# Patient Record
Sex: Male | Born: 1950 | Race: Black or African American | Hispanic: No | Smoking: Current every day smoker
Health system: Southern US, Community
[De-identification: ages and names within clinical notes are randomized; demographics above are authoritative.]

## PROBLEM LIST (undated history)

## (undated) DIAGNOSIS — C649 Malignant neoplasm of unspecified kidney, except renal pelvis: Secondary | ICD-10-CM

## (undated) DIAGNOSIS — N182 Chronic kidney disease, stage 2 (mild): Secondary | ICD-10-CM

## (undated) DIAGNOSIS — I714 Abdominal aortic aneurysm, without rupture, unspecified: Secondary | ICD-10-CM

## (undated) DIAGNOSIS — Z21 Asymptomatic human immunodeficiency virus [HIV] infection status: Secondary | ICD-10-CM

## (undated) DIAGNOSIS — I10 Essential (primary) hypertension: Secondary | ICD-10-CM

## (undated) DIAGNOSIS — B182 Chronic viral hepatitis C: Secondary | ICD-10-CM

## (undated) DIAGNOSIS — B20 Human immunodeficiency virus [HIV] disease: Secondary | ICD-10-CM

## (undated) HISTORY — PX: HERNIA REPAIR: SHX51

## (undated) HISTORY — PX: ABDOMINAL AORTIC ANEURYSM REPAIR: SUR1152

---

## 2011-07-09 ENCOUNTER — Encounter (HOSPITAL_COMMUNITY): Payer: Self-pay | Admitting: *Deleted

## 2011-07-09 ENCOUNTER — Emergency Department (HOSPITAL_COMMUNITY)
Admission: EM | Admit: 2011-07-09 | Discharge: 2011-07-10 | Disposition: A | Discharge status: Home / Self Care | Payer: Medicare Other | Attending: Emergency Medicine | Admitting: Emergency Medicine

## 2011-07-09 ENCOUNTER — Emergency Department (HOSPITAL_COMMUNITY): Payer: Medicare Other

## 2011-07-09 DIAGNOSIS — N289 Disorder of kidney and ureter, unspecified: Secondary | ICD-10-CM | POA: Insufficient documentation

## 2011-07-09 DIAGNOSIS — Z8619 Personal history of other infectious and parasitic diseases: Secondary | ICD-10-CM | POA: Insufficient documentation

## 2011-07-09 DIAGNOSIS — B20 Human immunodeficiency virus [HIV] disease: Secondary | ICD-10-CM

## 2011-07-09 DIAGNOSIS — Z79899 Other long term (current) drug therapy: Secondary | ICD-10-CM | POA: Insufficient documentation

## 2011-07-09 DIAGNOSIS — W19XXXA Unspecified fall, initial encounter: Secondary | ICD-10-CM

## 2011-07-09 DIAGNOSIS — M25519 Pain in unspecified shoulder: Secondary | ICD-10-CM | POA: Insufficient documentation

## 2011-07-09 DIAGNOSIS — T1490XA Injury, unspecified, initial encounter: Secondary | ICD-10-CM | POA: Insufficient documentation

## 2011-07-09 DIAGNOSIS — K5730 Diverticulosis of large intestine without perforation or abscess without bleeding: Secondary | ICD-10-CM | POA: Insufficient documentation

## 2011-07-09 DIAGNOSIS — W138XXA Fall from, out of or through other building or structure, initial encounter: Secondary | ICD-10-CM | POA: Insufficient documentation

## 2011-07-09 DIAGNOSIS — M549 Dorsalgia, unspecified: Secondary | ICD-10-CM | POA: Insufficient documentation

## 2011-07-09 DIAGNOSIS — C649 Malignant neoplasm of unspecified kidney, except renal pelvis: Secondary | ICD-10-CM | POA: Insufficient documentation

## 2011-07-09 DIAGNOSIS — I10 Essential (primary) hypertension: Secondary | ICD-10-CM | POA: Insufficient documentation

## 2011-07-09 DIAGNOSIS — R109 Unspecified abdominal pain: Secondary | ICD-10-CM | POA: Insufficient documentation

## 2011-07-09 DIAGNOSIS — Z21 Asymptomatic human immunodeficiency virus [HIV] infection status: Secondary | ICD-10-CM | POA: Insufficient documentation

## 2011-07-09 DIAGNOSIS — K8020 Calculus of gallbladder without cholecystitis without obstruction: Secondary | ICD-10-CM | POA: Insufficient documentation

## 2011-07-09 HISTORY — DX: Human immunodeficiency virus (HIV) disease: B20

## 2011-07-09 HISTORY — DX: Chronic viral hepatitis C: B18.2

## 2011-07-09 HISTORY — DX: Malignant neoplasm of unspecified kidney, except renal pelvis: C64.9

## 2011-07-09 HISTORY — DX: Chronic kidney disease, stage 2 (mild): N18.2

## 2011-07-09 HISTORY — DX: Abdominal aortic aneurysm, without rupture: I71.4

## 2011-07-09 HISTORY — DX: Abdominal aortic aneurysm, without rupture, unspecified: I71.40

## 2011-07-09 HISTORY — DX: Essential (primary) hypertension: I10

## 2011-07-09 HISTORY — DX: Asymptomatic human immunodeficiency virus (hiv) infection status: Z21

## 2011-07-09 LAB — POCT I-STAT, CHEM 8
BUN: 42 mg/dL — ABNORMAL HIGH (ref 6–23)
Calcium, Ion: 1.27 mmol/L (ref 1.12–1.32)
Calcium, Ion: 1.21 mmol/L (ref 1.12–1.32)
Creatinine, Ser: 2.1 mg/dL — ABNORMAL HIGH (ref 0.50–1.35)
Creatinine, Ser: 1.9 mg/dL — ABNORMAL HIGH (ref 0.50–1.35)
Glucose, Bld: 108 mg/dL — ABNORMAL HIGH (ref 70–99)
Glucose, Bld: 97 mg/dL (ref 70–99)
HCT: 36 % — ABNORMAL LOW (ref 39.0–52.0)
Hemoglobin: 12.9 g/dL — ABNORMAL LOW (ref 13.0–17.0)
Hemoglobin: 12.2 g/dL — ABNORMAL LOW (ref 13.0–17.0)
Sodium: 142 mEq/L (ref 135–145)
TCO2: 19 mmol/L (ref 0–100)
TCO2: 16 mmol/L (ref 0–100)

## 2011-07-09 LAB — CBC
Hemoglobin: 12.9 g/dL — ABNORMAL LOW (ref 13.0–17.0)
MCH: 30.4 pg (ref 26.0–34.0)
MCHC: 33.6 g/dL (ref 30.0–36.0)
MCV: 90.6 fL (ref 78.0–100.0)
RBC: 4.24 MIL/uL (ref 4.22–5.81)

## 2011-07-09 MED ORDER — MORPHINE SULFATE 4 MG/ML IJ SOLN
4.0000 mg | Freq: Once | INTRAMUSCULAR | Status: AC
  Administered 2011-07-09: 4 mg via INTRAVENOUS
  Filled 2011-07-09: qty 1

## 2011-07-09 MED ORDER — ONDANSETRON HCL 4 MG/2ML IJ SOLN
4.0000 mg | Freq: Once | INTRAMUSCULAR | Status: AC
  Administered 2011-07-09: 4 mg via INTRAVENOUS
  Filled 2011-07-09: qty 2

## 2011-07-09 MED ORDER — TRAMADOL HCL 50 MG PO TABS: 50.0000 mg | ORAL_TABLET | Freq: Four times a day (QID) | ORAL | Status: DC | PRN

## 2011-07-09 MED ORDER — SODIUM CHLORIDE 0.9 % IV BOLUS (SEPSIS): 2000.0000 mL | Freq: Once | INTRAVENOUS | Status: DC

## 2011-07-09 NOTE — ED Notes (Signed)
Pt states he landed on his left side. Pt c/o of right shoulder pain and LLQ abdominal pain with tenderness

## 2011-07-09 NOTE — ED Provider Notes (Signed)
Medical screening examination/treatment/procedure(s) were performed by non-physician practitioner and as supervising physician I was immediately available for consultation/collaboration.  Yanis Juma R. Halina Asano, MD 07/09/11 2353 

## 2011-07-09 NOTE — ED Provider Notes (Signed)
History     CSN: [id number]  Arrival date & time 07/09/11  1815   First MD Initiated Contact with Patient 07/09/11 2027      Chief Complaint  Patient presents with  . Fall  . Back Pain  . Rib Pain   . Chest Pain    (Consider location/radiation/quality/duration/timing/severity/associated sxs/prior treatment) HPI [facility] Dr. Copper  Dr. Earlene Plater  Pt brought to ED and put in exam room coded as a level 3. He was brought in by his sister after falling off of a second story roof and landed on his left side. He denies head pain or neck pain, denies LOC. He admits to right shoulder pain, lumbar pain and left sided flank pain. He says he was up their cleaning a friends gutter. He states that what hurts the worst is his abdomen. . The patient denies feeling lightheaded dizzy or confused. His VSS, he is in NAD and denies pain medication at this time.  Patient has PMH of renal cell cancer, renal insufficiency and HIV. The patient is also requesting Detox from heroine.  Past Medical History  Diagnosis Date  . Abdominal aortic aneurysm   . Hypertension   . Renal cell cancer   . Hepatitis C carrier   . Chronic renal insufficiency, stage II (mild)   . HIV (human immunodeficiency virus infection)   . Chronic hepatitis C     Past Surgical History  Procedure Date  . Hernia repair   . Abdominal aortic aneurysm repair     No family history on file.  History  Substance Use Topics  . Smoking status: Current Everyday Smoker  . Smokeless tobacco: Not on file  . Alcohol Use: Yes     pint of liquor a day.      Review of Systems   HEENT: denies blurry vision or change in hearing PULMONARY: Denies difficulty breathing and SOB CARDIAC: denies chest pain or heart palpitations MUSCULOSKELETAL:  denies being unable to ambulate ABDOMEN AL: denies abdominal pain GU: denies loss of bowel or urinary control NEURO: denies numbness and tingling in extremities SKIN: no new  rashes PSYCH: patient behavior is normal NECK: Not complaining of neck pain     Allergies  Review of patient's allergies indicates no known allergies.  Home Medications   Current Outpatient Rx  Name Route Sig Dispense Refill  . ABACAVIR SULFATE-LAMIVUDINE 600-300 MG PO TABS Oral Take 1 tablet by mouth daily.    . EFAVIRENZ 600 MG PO TABS Oral Take 600 mg by mouth at bedtime.    Marland Kitchen LISINOPRIL-HYDROCHLOROTHIAZIDE 20-25 MG PO TABS Oral Take 1 tablet by mouth daily.    Marland Kitchen ZOLPIDEM TARTRATE 5 MG PO TABS Oral Take 5 mg by mouth at bedtime as needed. sleep    . TRAMADOL HCL 50 MG PO TABS Oral Take 1 tablet (50 mg total) by mouth every 6 (six) hours as needed. pain 15 tablet 0    BP 166/103  Pulse 109  Temp 98.1 F (36.7 C) (Oral)  Resp 22  SpO2 94%  Physical Exam  Nursing note and vitals reviewed. Constitutional: He appears well-developed and well-nourished. No distress.  HENT:  Head: Normocephalic and atraumatic.  Eyes: Pupils are equal, round, and reactive to light.  Neck: Normal range of motion. Neck supple.  Cardiovascular: Normal rate and regular rhythm.   Pulmonary/Chest: Effort normal.  Abdominal: Soft. He exhibits no distension. There is tenderness. There is guarding. There is no rebound.  Musculoskeletal:  Equal strength to bilateral lower extremities. Neurosensory  function adequate to both legs. Skin color is normal. Skin is warm and moist. I see no step off deformity, no bony tenderness. Pt is able to ambulate without limp. Pain is relieved when sitting in certain positions. ROM is decreased due to pain. No crepitus, laceration, effusion, swelling.  Pulses are normal   Neurological: He is alert.  Skin: Skin is warm and dry.    ED Course  Procedures (including critical care time)  Labs Reviewed  CBC - Abnormal; Notable for the following:    Hemoglobin 12.9 (*)     HCT 38.4 (*)     Platelets 148 (*)     All other components within normal limits  POCT  I-STAT, CHEM 8 - Abnormal; Notable for the following:    Chloride 114 (*)     BUN 42 (*)     Creatinine, Ser 2.10 (*)     Glucose, Bld 108 (*)     Hemoglobin 12.9 (*)     HCT 38.0 (*)     All other components within normal limits  POCT I-STAT, CHEM 8 - Abnormal; Notable for the following:    Chloride 114 (*)     BUN 38 (*)     Creatinine, Ser 1.90 (*)     Hemoglobin 12.2 (*)     HCT 36.0 (*)     All other components within normal limits   Ct Abdomen Pelvis Wo Contrast  07/09/2011  *RADIOLOGY REPORT*  Clinical Data: Fall.  abdominal and back pain.  Renal insufficiency.  CT ABDOMEN AND PELVIS WITHOUT CONTRAST  Technique:  Multidetector CT imaging of the abdomen and pelvis was performed following the standard protocol without intravenous contrast.  Comparison: None.  Findings: No evidence of ureteral calculi or hydronephrosis.  The right kidney contains multiple high attenuation lesions which may represent hemorrhagic cysts although solid renal masses cannot be excluded.  There is also a 5 cm cystic lesion in the upper pole which contains peripheral nodular calcification, but cannot be fully characterized on this noncontrast study.  The left kidney also contains several high attenuation lesions which may represent hemorrhagic cysts although solid masses cannot be excluded on this noncontrast study.  A subcapsular mass in the mid pole of the right kidney measuring 3.2 cm contains some internal fat, consistent with a benign angiomyolipoma.  Cholelithiasis is seen, without evidence of acute cholecystitis. The liver, spleen, pancreas, and adrenal glands have a normal appearance on this noncontrast study.  No lymphadenopathy identified.  The patient has undergone stent graft repair of abdominal aortic aneurysm and right common iliac artery aneurysm.  No evidence of retroperitoneal hemorrhage.  No inflammatory process or abnormal fluid collections are identified.  Normal appendix is visualized.  Diverticulosis is seen involving the descending and sigmoid colon, however there is no evidence of diverticulitis.  IMPRESSION:  1.  No evidence of ureteral calculi, hydronephrosis, or other acute findings. 2.  Bilateral indeterminate renal lesions.  Renal neoplasm cannot be excluded. Abdomen MRI without contrast should be considered for further characterization. 3.  3 cm benign left renal angiomyolipoma. 4.  Cholelithiasis. 5.  Diverticulosis.  No radiographic evidence of diverticulitis.  Original Report Authenticated By: Danae Orleans, M.D.   Dg Chest 2 View  07/09/2011  *RADIOLOGY REPORT*  Clinical Data: Fall, back pain, chest pain  CHEST - 2 VIEW  Comparison: None.  Findings: Cardiomediastinal silhouette is unremarkable.  No acute infiltrate or pleural effusion.  No pulmonary  edema.  Bony thorax is unremarkable.  IMPRESSION: No active disease.  Original Report Authenticated By: Natasha Mead, M.D.   Dg Lumbar Spine Complete  07/09/2011  *RADIOLOGY REPORT*  Clinical Data: Larey Seat and injured low back.  LUMBAR SPINE - COMPLETE 4+ VIEW  Comparison: None.  Findings: Five non-rib bearing lumbar vertebrae with anatomic posterior alignment.  Straightening of the usual lumbar lordosis. Lumbar scoliosis convex right.  No fractures.  Disc space narrowing and endplate hypertrophic changes at every level from L2-3 through L5-S1, worst at L3-4.  Diffuse facet degenerative changes.  No pars defects.  Visualized sacroiliac joints intact.  Prior aorto-bi- iliac stent graft.  IMPRESSION: No acute osseous abnormality.  Diffuse degenerative disc disease, spondylosis, and facet degenerative changes.  Straightening of the usual lordosis which may reflect positioning and/or spasm. Scoliosis.  Original Report Authenticated By: Arnell Sieving, M.D.   Dg Shoulder Right  07/09/2011  *RADIOLOGY REPORT*  Clinical Data: Larey Seat and injured right shoulder.  RIGHT SHOULDER - 2+ VIEW  Comparison: None.  Findings: No evidence of acute  fracture or dislocation.  Slight narrowing of the subacromial space.  Acromioclavicular joint intact.  Well-preserved bone mineral density.  IMPRESSION: No acute osseous abnormality.  Slight narrowing the subacromial space which can be seen with supraspinatus tendon impingement.  Original Report Authenticated By: Arnell Sieving, M.D.     1. Fall   2. Renal insufficiency   3. HIV (human immunodeficiency virus infection)   4. Renal cell cancer       MDM  Patients labs have come back non acute. With signs of muscle strains but no fractures or intraabdominal bleeding.  Patient oral challenged in the ED. ACT team has given patient detox referrals for heroine.  Patients creatinine is at baseline although elevated.  Patient re-evaluated prior to discharge. No neurological deficits and belly pain continues to be resolving.  Patient is to follow-up with his PCP.  Pt has been advised of the symptoms that warrant their return to the ED. Patient has voiced understanding and has agreed to follow-up with the PCP or specialist.         Dorthula Matas, PA 07/09/11 2341

## 2011-07-09 NOTE — ED Notes (Signed)
Pt reports falling off a roof approx 2 stories high approx 1745 today. C/o back pain, L rib pain, L chest pain, pain worse with movement and a deep breath.

## 2011-12-05 ENCOUNTER — Encounter (HOSPITAL_COMMUNITY): Payer: Self-pay

## 2011-12-05 ENCOUNTER — Emergency Department (HOSPITAL_COMMUNITY)
Admission: EM | Admit: 2011-12-05 | Discharge: 2011-12-07 | Disposition: A | Discharge status: Other Acute Inpatient Hospital | Payer: Medicare Other | Source: Home / Self Care | Attending: Emergency Medicine | Admitting: Emergency Medicine

## 2011-12-05 DIAGNOSIS — F3289 Other specified depressive episodes: Secondary | ICD-10-CM | POA: Insufficient documentation

## 2011-12-05 DIAGNOSIS — F17200 Nicotine dependence, unspecified, uncomplicated: Secondary | ICD-10-CM | POA: Insufficient documentation

## 2011-12-05 DIAGNOSIS — F329 Major depressive disorder, single episode, unspecified: Secondary | ICD-10-CM | POA: Insufficient documentation

## 2011-12-05 DIAGNOSIS — C649 Malignant neoplasm of unspecified kidney, except renal pelvis: Secondary | ICD-10-CM | POA: Insufficient documentation

## 2011-12-05 DIAGNOSIS — Z21 Asymptomatic human immunodeficiency virus [HIV] infection status: Secondary | ICD-10-CM | POA: Insufficient documentation

## 2011-12-05 DIAGNOSIS — I129 Hypertensive chronic kidney disease with stage 1 through stage 4 chronic kidney disease, or unspecified chronic kidney disease: Secondary | ICD-10-CM | POA: Insufficient documentation

## 2011-12-05 DIAGNOSIS — I714 Abdominal aortic aneurysm, without rupture, unspecified: Secondary | ICD-10-CM | POA: Insufficient documentation

## 2011-12-05 DIAGNOSIS — Z79899 Other long term (current) drug therapy: Secondary | ICD-10-CM | POA: Insufficient documentation

## 2011-12-05 DIAGNOSIS — F1110 Opioid abuse, uncomplicated: Secondary | ICD-10-CM | POA: Insufficient documentation

## 2011-12-05 DIAGNOSIS — B182 Chronic viral hepatitis C: Secondary | ICD-10-CM | POA: Insufficient documentation

## 2011-12-05 DIAGNOSIS — F1010 Alcohol abuse, uncomplicated: Secondary | ICD-10-CM

## 2011-12-05 DIAGNOSIS — N182 Chronic kidney disease, stage 2 (mild): Secondary | ICD-10-CM | POA: Insufficient documentation

## 2011-12-05 LAB — CBC
HCT: 32.8 % — ABNORMAL LOW (ref 39.0–52.0)
Hemoglobin: 10.9 g/dL — ABNORMAL LOW (ref 13.0–17.0)
MCH: 30 pg (ref 26.0–34.0)
MCHC: 33.2 g/dL (ref 30.0–36.0)
MCV: 90.4 fL (ref 78.0–100.0)
Platelets: 165 10*3/uL (ref 150–400)
RBC: 3.63 MIL/uL — ABNORMAL LOW (ref 4.22–5.81)
RDW: 14.3 % (ref 11.5–15.5)
WBC: 4.5 10*3/uL (ref 4.0–10.5)

## 2011-12-05 LAB — RAPID URINE DRUG SCREEN, HOSP PERFORMED
Amphetamines: NOT DETECTED
Barbiturates: NOT DETECTED
Benzodiazepines: NOT DETECTED
Cocaine: NOT DETECTED
Opiates: POSITIVE — AB
Tetrahydrocannabinol: NOT DETECTED

## 2011-12-05 LAB — BASIC METABOLIC PANEL
BUN: 36 mg/dL — ABNORMAL HIGH (ref 6–23)
CO2: 20 mEq/L (ref 19–32)
Calcium: 9.3 mg/dL (ref 8.4–10.5)
Chloride: 108 mEq/L (ref 96–112)
Creatinine, Ser: 1.74 mg/dL — ABNORMAL HIGH (ref 0.50–1.35)
GFR calc Af Amer: 48 mL/min — ABNORMAL LOW (ref >90)
GFR calc non Af Amer: 41 mL/min — ABNORMAL LOW (ref >90)
Glucose, Bld: 111 mg/dL — ABNORMAL HIGH (ref 70–99)
Potassium: 5.2 mEq/L — ABNORMAL HIGH (ref 3.5–5.1)
Sodium: 139 mEq/L (ref 135–145)

## 2011-12-05 LAB — ETHANOL: Alcohol, Ethyl (B): 21 mg/dL — ABNORMAL HIGH (ref 0–11)

## 2011-12-05 NOTE — BH Assessment (Signed)
Assessment Note   Russell [name] is an 61 y.o. male that was assessed this day.  Pt is self-referred seeking detox from heroin and ETOH.  Pt reported he relapsed in January of this year and his use became worse after the death of his mother in 04-17-22 and his sister-in-law soon thereafter.  Pt reports he drinks 1 pint of wine daily, last use this morning and pt drank one pint.  Pt reports using 4-5 bags of heroin a day, last use was 3 bags yesterday on 12/04/11.  Pt stated his use is affecting his finances and he was unable to pay his bills.  Pt also reports he wants to get better physically, as he has several medical problems.  Pt has had detox once years ago and no other MH or SA treatment.  Pt does endorse sx of depression and is suffering from bereavement.  Pt pleasant, cooperative and wants detox.  Pt denies SI/HI or psychosis.  Pt denies seizure hx.  Completed assessment, assessment notification and faxed to [facility] to run for possible admission.  Updated ED staff.  Axis I: Bereavement, Depressive Disorder NOS and Opioid Dependence, Alcohol Dependence Axis II: Deferred Axis III:  Past Medical History  Diagnosis Date  . Abdominal aortic aneurysm   . Hypertension   . Renal cell cancer   . Hepatitis C carrier   . Chronic renal insufficiency, stage II (mild)   . HIV (human immunodeficiency virus infection)   . Chronic hepatitis C    Axis IV: economic problems, occupational problems and other psychosocial or environmental problems Axis V: 31-40 impairment in reality testing  Past Medical History:  Past Medical History  Diagnosis Date  . Abdominal aortic aneurysm   . Hypertension   . Renal cell cancer   . Hepatitis C carrier   . Chronic renal insufficiency, stage II (mild)   . HIV (human immunodeficiency virus infection)   . Chronic hepatitis C     Past Surgical History  Procedure Date  . Hernia repair   . Abdominal aortic aneurysm repair     Family History: No family history on  file.  Social History:  reports that he has been smoking.  He does not have any smokeless tobacco history on file. He reports that he drinks alcohol. He reports that he uses illicit drugs (IV).  Additional Social History:  Alcohol / Drug Use Pain Medications: see MAR Prescriptions: see MAR Over the Counter: see MAR History of alcohol / drug use?: Yes Substance #1 Name of Substance 1: Heroin 1 - Age of First Use: 25 1 - Amount (size/oz): 4-5 bags 1 - Frequency: daily 1 - Duration: ongoing on and off for years 1 - Last Use / Amount: 12/04/11 at 0100 - 3 bags Substance #2 Name of Substance 2: ETOH - wine 2 - Age of First Use: 22 2 - Amount (size/oz): 1 pint 2 - Frequency: daily 2 - Duration: Ongoing since Feb 2013 2 - Last Use / Amount: 12/04/12 - 1 pint this AM  CIWA: CIWA-Ar BP: 126/69 mmHg Pulse Rate: 98  Nausea and Vomiting: no nausea and no vomiting Tactile Disturbances: none Tremor: no tremor Auditory Disturbances: not present Paroxysmal Sweats: no sweat visible Visual Disturbances: not present Anxiety: no anxiety, at ease Headache, Fullness in Head: none present Agitation: normal activity Orientation and Clouding of Sensorium: oriented and can do serial additions CIWA-Ar Total: 0  COWS: Clinical Opiate Withdrawal Scale (COWS) Resting Pulse Rate: Pulse Rate 80 or below Sweating: No  report of chills or flushing Restlessness: Able to sit still Pupil Size: Pupils pinned or normal size for room light Bone or Joint Aches: Not present Runny Nose or Tearing: Not present GI Upset: No GI symptoms Tremor: No tremor Yawning: No yawning Anxiety or Irritability: None Gooseflesh Skin: Skin is smooth COWS Total Score: 0   Allergies: No Known Allergies  Home Medications:  (Not in a hospital admission)  OB/GYN Status:  No LMP for male patient.  General Assessment Data Location of Assessment: [facility] ED Living Arrangements: Other relatives (With sister) Can pt return to  current living arrangement?: Yes Admission Status: Voluntary Is patient capable of signing voluntary admission?: Yes Transfer from: Acute Hospital Referral Source: Self/Family/Friend  Education Status Is patient currently in school?: No  Risk to self Suicidal Ideation: No Suicidal Intent: No Is patient at risk for suicide?: No Suicidal Plan?: No Access to Means: No What has been your use of drugs/alcohol within the last 12 months?: Using ETOH and heroin Previous Attempts/Gestures: No How many times?: 0  Other Self Harm Risks: pt denies Triggers for Past Attempts: None known Intentional Self Injurious Behavior: None Family Suicide History: No Recent stressful life event(s): Turmoil (Comment);Other (Comment) (grief, medical issues, ongoing SA) Persecutory voices/beliefs?: No Depression: Yes Depression Symptoms: Despondent;Guilt Substance abuse history and/or treatment for substance abuse?: Yes Suicide prevention information given to non-admitted patients: Not applicable  Risk to Others Homicidal Ideation: No Thoughts of Harm to Others: No Current Homicidal Intent: No Current Homicidal Plan: No Access to Homicidal Means: No Identified Victim: na History of harm to others?: No Assessment of Violence: None Noted Violent Behavior Description: na - pt calm, cooperative Does patient have access to weapons?: No Criminal Charges Pending?: No Does patient have a court date: No  Psychosis Hallucinations: None noted Delusions: None noted  Mental Status Report Appear/Hygiene: Other (Comment) (casual in scrubs) Eye Contact: Good Motor Activity: Unremarkable Speech: Logical/coherent;Soft Level of Consciousness: Alert Mood: Depressed Affect: Appropriate to circumstance Anxiety Level: None Thought Processes: Coherent;Relevant Judgement: Unimpaired Orientation: Person;Place;Time;Situation Obsessive Compulsive Thoughts/Behaviors: None  Cognitive Functioning Concentration:  Normal Memory: Recent Intact;Remote Intact IQ: Average Insight: Fair Impulse Control: Poor Appetite: Fair Weight Loss:  (yes, but unsure of amount) Weight Gain: 0  Sleep: Decreased Total Hours of Sleep:  (intermittent sleep) Vegetative Symptoms: None  ADLScreening [facility] Assessment Services) Patient's cognitive ability adequate to safely complete daily activities?: Yes Patient able to express need for assistance with ADLs?: Yes Independently performs ADLs?: Yes (appropriate for developmental age)  Abuse/Neglect [facility]) Physical Abuse: Denies Verbal Abuse: Denies Sexual Abuse: Denies  Prior Inpatient Therapy Prior Inpatient Therapy: Yes Prior Therapy Dates: Years ago - pt cannot recall date Prior Therapy Facilty/Provider(s): [facility] Reason for Treatment: Detox  Prior Outpatient Therapy Prior Outpatient Therapy: No Prior Therapy Dates: na Prior Therapy Facilty/Provider(s): na Reason for Treatment: na  ADL Screening (condition at time of admission) Patient's cognitive ability adequate to safely complete daily activities?: Yes Patient able to express need for assistance with ADLs?: Yes Independently performs ADLs?: Yes (appropriate for developmental age) Weakness of Legs: None Weakness of Arms/Hands: None  Home Assistive Devices/Equipment Home Assistive Devices/Equipment: None    Abuse/Neglect Assessment (Assessment to be complete while patient is alone) Physical Abuse: Denies Verbal Abuse: Denies Sexual Abuse: Denies Exploitation of patient/patient's resources: Denies Self-Neglect: Denies Values / Beliefs Cultural Requests During Hospitalization: None Spiritual Requests During Hospitalization: None Consults Spiritual Care Consult Needed: No Social Work Consult Needed: No Merchant navy officer (For Healthcare) Advance Directive: Patient  does not have advance directive;Patient would not like information    Additional Information 1:1 In Past 12 Months?: No CIRT Risk:  No Elopement Risk: No Does patient have medical clearance?: Yes     Disposition:  Disposition Disposition of Patient: Referred to;Inpatient treatment program Type of inpatient treatment program: Adult Patient referred to: Other (Comment) (Pending [facility])  On Site Evaluation by:   Reviewed with Physician:  Ebbie Ridge, PA   Caryl Comes 12/05/2011 6:39 PM

## 2011-12-05 NOTE — ED Notes (Signed)
Pt moved to C 25, Report given to RN

## 2011-12-05 NOTE — ED Notes (Signed)
Pt is alert and cooperative on arrival to ED. Lunch bag given to pt

## 2011-12-05 NOTE — ED Notes (Signed)
Pt denies SI/HI/AVH.  Pt states that he wants detox from heroin and wine. Pt last used heroin at 2100 yesterday and his last usage of ETOH was 0900 today. Pt denies any detox symptoms at the present moment.

## 2011-12-05 NOTE — ED Provider Notes (Signed)
History     CSN: [id number]  Arrival date & time 12/05/11  1204   First MD Initiated Contact with Patient 12/05/11 1225      No chief complaint on file.   (Consider location/radiation/quality/duration/timing/severity/associated sxs/prior treatment) HPI Patient presents to the emergency department for detox from heroin and alcohol. patient states that he was using heroin to control chronic pain.  Patient denies suicidal or homicidal ideation.  Patient, states, that he's not having any hallucinations, chest pain, shortness of breath, depression, anxiety, or weakness.  Patient, states he last used earlier today.  Past Medical History  Diagnosis Date  . Abdominal aortic aneurysm   . Hypertension   . Renal cell cancer   . Hepatitis C carrier   . Chronic renal insufficiency, stage II (mild)   . HIV (human immunodeficiency virus infection)   . Chronic hepatitis C     Past Surgical History  Procedure Date  . Hernia repair   . Abdominal aortic aneurysm repair     No family history on file.  History  Substance Use Topics  . Smoking status: Current Every Day Smoker  . Smokeless tobacco: Not on file  . Alcohol Use: Yes     Comment: pint of liquor a day.      Review of Systems All other systems negative except as documented in the HPI. All pertinent positives and negatives as reviewed in the HPI.  Allergies  Review of patient's allergies indicates no known allergies.  Home Medications   Current Outpatient Rx  Name  Route  Sig  Dispense  Refill  . ABACAVIR SULFATE-LAMIVUDINE 600-300 MG PO TABS   Oral   Take 1 tablet by mouth daily.         . EFAVIRENZ 600 MG PO TABS   Oral   Take 600 mg by mouth at bedtime.         Marland Kitchen LISINOPRIL-HYDROCHLOROTHIAZIDE 20-25 MG PO TABS   Oral   Take 1 tablet by mouth daily.         . TRAMADOL HCL 50 MG PO TABS   Oral   Take 1 tablet (50 mg total) by mouth every 6 (six) hours as needed. pain   15 tablet   0     BP 126/69   Pulse 98  Temp 96.8 F (36 C) (Oral)  Resp 20  SpO2 94%  Physical Exam  Nursing note and vitals reviewed. Constitutional: He is oriented to person, place, and time. He appears well-developed and well-nourished. No distress.  HENT:  Head: Normocephalic and atraumatic.  Mouth/Throat: Oropharynx is clear and moist.  Eyes: Pupils are equal, round, and reactive to light.  Neck: Normal range of motion. Neck supple.  Cardiovascular: Normal rate, regular rhythm and normal heart sounds.  Exam reveals no gallop and no friction rub.   No murmur heard. Pulmonary/Chest: Effort normal and breath sounds normal. No respiratory distress.  Neurological: He is alert and oriented to person, place, and time.  Skin: Skin is warm and dry.  Psychiatric: He has a normal mood and affect. His behavior is normal. Judgment and thought content normal.    ED Course  Procedures (including critical care time)  The patient will be evaluated by the ACT Team.    MDM          Carlyle Dolly, PA-C 12/05/11 1446

## 2011-12-05 NOTE — ED Notes (Signed)
Pt presents for detox from herione and wine. Pt reports last using heroine last night and wine this morning.  Pt denies any suicidal or homocidal ideations.

## 2011-12-06 MED ORDER — EFAVIRENZ 600 MG PO TABS
600.0000 mg | ORAL_TABLET | Freq: Every day | ORAL | Status: DC
  Administered 2011-12-06: 600 mg via ORAL
  Filled 2011-12-06 (×4): qty 1

## 2011-12-06 MED ORDER — ACETAMINOPHEN 325 MG PO TABS: 650.0000 mg | ORAL_TABLET | ORAL | Status: DC | PRN

## 2011-12-06 MED ORDER — NICOTINE 21 MG/24HR TD PT24
21.0000 mg | MEDICATED_PATCH | Freq: Every day | TRANSDERMAL | Status: DC
  Administered 2011-12-06 – 2011-12-07 (×2): 21 mg via TRANSDERMAL
  Filled 2011-12-06 (×2): qty 1

## 2011-12-06 MED ORDER — HYDROCHLOROTHIAZIDE 25 MG PO TABS
25.0000 mg | ORAL_TABLET | Freq: Every day | ORAL | Status: DC
  Administered 2011-12-06 – 2011-12-07 (×2): 25 mg via ORAL
  Filled 2011-12-06 (×2): qty 1

## 2011-12-06 MED ORDER — ABACAVIR SULFATE-LAMIVUDINE 600-300 MG PO TABS
1.0000 | ORAL_TABLET | Freq: Every day | ORAL | Status: DC
  Filled 2011-12-06 (×2): qty 1

## 2011-12-06 MED ORDER — IBUPROFEN 200 MG PO TABS: 600.0000 mg | ORAL_TABLET | Freq: Three times a day (TID) | ORAL | Status: DC | PRN

## 2011-12-06 MED ORDER — LAMIVUDINE 150 MG PO TABS
300.0000 mg | ORAL_TABLET | Freq: Every day | ORAL | Status: DC
  Administered 2011-12-06 – 2011-12-07 (×2): 300 mg via ORAL
  Filled 2011-12-06 (×3): qty 2

## 2011-12-06 MED ORDER — LISINOPRIL 20 MG PO TABS
20.0000 mg | ORAL_TABLET | Freq: Every day | ORAL | Status: DC
  Administered 2011-12-06 – 2011-12-07 (×2): 20 mg via ORAL
  Filled 2011-12-06 (×2): qty 1

## 2011-12-06 MED ORDER — ZOLPIDEM TARTRATE 5 MG PO TABS: 5.0000 mg | ORAL_TABLET | Freq: Every evening | ORAL | Status: DC | PRN

## 2011-12-06 MED ORDER — LORAZEPAM 1 MG PO TABS
1.0000 mg | ORAL_TABLET | Freq: Three times a day (TID) | ORAL | Status: DC | PRN
  Administered 2011-12-06 – 2011-12-07 (×4): 1 mg via ORAL
  Filled 2011-12-06 (×4): qty 1

## 2011-12-06 MED ORDER — ALUM & MAG HYDROXIDE-SIMETH 200-200-20 MG/5ML PO SUSP: 30.0000 mL | ORAL | Status: DC | PRN

## 2011-12-06 MED ORDER — ABACAVIR SULFATE 300 MG PO TABS
600.0000 mg | ORAL_TABLET | Freq: Every day | ORAL | Status: DC
  Administered 2011-12-06 – 2011-12-07 (×2): 600 mg via ORAL
  Filled 2011-12-06 (×3): qty 2

## 2011-12-06 MED ORDER — ONDANSETRON HCL 8 MG PO TABS: 4.0000 mg | ORAL_TABLET | Freq: Three times a day (TID) | ORAL | Status: DC | PRN

## 2011-12-06 MED ORDER — TRAMADOL HCL 50 MG PO TABS
50.0000 mg | ORAL_TABLET | Freq: Four times a day (QID) | ORAL | Status: DC | PRN
  Administered 2011-12-06 – 2011-12-07 (×3): 50 mg via ORAL
  Filled 2011-12-06 (×3): qty 1

## 2011-12-06 MED ORDER — LISINOPRIL-HYDROCHLOROTHIAZIDE 20-25 MG PO TABS: 1.0000 | ORAL_TABLET | Freq: Every day | ORAL | Status: DC

## 2011-12-06 NOTE — ED Provider Notes (Signed)
Medical screening examination/treatment/procedure(s) were performed by non-physician practitioner and as supervising physician I was immediately available for consultation/collaboration.  Timmi Devora, MD 12/06/11 0851 

## 2011-12-06 NOTE — ED Notes (Signed)
First meeting with patient. Patient awake and resting with NAD. Patient up to restroom.

## 2011-12-06 NOTE — ED Notes (Signed)
Pt reports "I need something for anxiety".  Sitting on side of bed, wrenching hands.  Resps even/ unlabored, skin warm/ dry and color WNL for race.  NAD.

## 2011-12-06 NOTE — ED Notes (Signed)
Patient c/o bilateral leg pain and bilateral leg cramps.

## 2011-12-06 NOTE — ED Notes (Signed)
Breakfast tray served at this time.

## 2011-12-06 NOTE — BH Assessment (Signed)
[facility] Assessment Progress Note      Per Britta Mccreedy at [facility], pt was accepted pending a 300 hall bed.

## 2011-12-06 NOTE — Progress Notes (Signed)
8:03 AM Pt eating breakfast.  He is waiting for a Behavioral Health bed to open up.

## 2011-12-07 ENCOUNTER — Encounter (HOSPITAL_COMMUNITY): Payer: Self-pay | Admitting: *Deleted

## 2011-12-07 ENCOUNTER — Inpatient Hospital Stay (HOSPITAL_COMMUNITY)
Admission: AD | Admit: 2011-12-07 | Discharge: 2011-12-13 | DRG: 896 | Disposition: A | Discharge status: Home / Self Care | Payer: Medicare Other | Source: Ambulatory Visit | Attending: Psychiatry | Admitting: Psychiatry

## 2011-12-07 DIAGNOSIS — F1120 Opioid dependence, uncomplicated: Principal | ICD-10-CM | POA: Diagnosis present

## 2011-12-07 DIAGNOSIS — F1020 Alcohol dependence, uncomplicated: Secondary | ICD-10-CM | POA: Diagnosis present

## 2011-12-07 DIAGNOSIS — I129 Hypertensive chronic kidney disease with stage 1 through stage 4 chronic kidney disease, or unspecified chronic kidney disease: Secondary | ICD-10-CM | POA: Diagnosis present

## 2011-12-07 DIAGNOSIS — Z21 Asymptomatic human immunodeficiency virus [HIV] infection status: Secondary | ICD-10-CM | POA: Diagnosis present

## 2011-12-07 DIAGNOSIS — B182 Chronic viral hepatitis C: Secondary | ICD-10-CM | POA: Diagnosis present

## 2011-12-07 DIAGNOSIS — N182 Chronic kidney disease, stage 2 (mild): Secondary | ICD-10-CM | POA: Diagnosis present

## 2011-12-07 DIAGNOSIS — B20 Human immunodeficiency virus [HIV] disease: Secondary | ICD-10-CM | POA: Diagnosis present

## 2011-12-07 MED ORDER — CLONIDINE HCL 0.1 MG PO TABS
0.1000 mg | ORAL_TABLET | Freq: Every day | ORAL | Status: AC
  Administered 2011-12-12 – 2011-12-13 (×2): 0.1 mg via ORAL
  Filled 2011-12-07 (×2): qty 1

## 2011-12-07 MED ORDER — CHLORDIAZEPOXIDE HCL 25 MG PO CAPS
25.0000 mg | ORAL_CAPSULE | ORAL | Status: AC
  Administered 2011-12-10 (×2): 25 mg via ORAL
  Filled 2011-12-07 (×2): qty 1

## 2011-12-07 MED ORDER — CHLORDIAZEPOXIDE HCL 25 MG PO CAPS
25.0000 mg | ORAL_CAPSULE | Freq: Four times a day (QID) | ORAL | Status: AC | PRN
  Administered 2011-12-07: 25 mg via ORAL
  Filled 2011-12-07: qty 1

## 2011-12-07 MED ORDER — LOPERAMIDE HCL 2 MG PO CAPS: 2.0000 mg | ORAL_CAPSULE | ORAL | Status: AC | PRN

## 2011-12-07 MED ORDER — ALUM & MAG HYDROXIDE-SIMETH 200-200-20 MG/5ML PO SUSP: 30.0000 mL | ORAL | Status: DC | PRN

## 2011-12-07 MED ORDER — EFAVIRENZ 600 MG PO TABS
600.0000 mg | ORAL_TABLET | Freq: Every day | ORAL | Status: DC
  Administered 2011-12-07 – 2011-12-11 (×5): 600 mg via ORAL
  Filled 2011-12-07 (×8): qty 1

## 2011-12-07 MED ORDER — ONDANSETRON 4 MG PO TBDP: 4.0000 mg | ORAL_TABLET | Freq: Four times a day (QID) | ORAL | Status: AC | PRN

## 2011-12-07 MED ORDER — ADULT MULTIVITAMIN W/MINERALS CH
1.0000 | ORAL_TABLET | Freq: Every day | ORAL | Status: DC
  Administered 2011-12-07 – 2011-12-13 (×7): 1 via ORAL
  Filled 2011-12-07 (×9): qty 1

## 2011-12-07 MED ORDER — CLONIDINE HCL 0.1 MG PO TABS
0.1000 mg | ORAL_TABLET | ORAL | Status: AC
  Administered 2011-12-07 – 2011-12-10 (×4): 0.1 mg via ORAL
  Filled 2011-12-07 (×4): qty 1

## 2011-12-07 MED ORDER — THIAMINE HCL 100 MG/ML IJ SOLN: 100.0000 mg | Freq: Once | INTRAMUSCULAR | Status: DC

## 2011-12-07 MED ORDER — METHOCARBAMOL 500 MG PO TABS
500.0000 mg | ORAL_TABLET | Freq: Three times a day (TID) | ORAL | Status: AC | PRN
  Administered 2011-12-07 – 2011-12-12 (×2): 500 mg via ORAL
  Filled 2011-12-07 (×2): qty 1

## 2011-12-07 MED ORDER — CLONIDINE HCL 0.1 MG PO TABS
0.1000 mg | ORAL_TABLET | Freq: Four times a day (QID) | ORAL | Status: AC
  Administered 2011-12-07 – 2011-12-09 (×7): 0.1 mg via ORAL
  Filled 2011-12-07 (×8): qty 1

## 2011-12-07 MED ORDER — LISINOPRIL-HYDROCHLOROTHIAZIDE 20-25 MG PO TABS
1.0000 | ORAL_TABLET | Freq: Every day | ORAL | Status: DC
Start: 2011-12-07 — End: 2011-12-07

## 2011-12-07 MED ORDER — HYDROXYZINE HCL 25 MG PO TABS
25.0000 mg | ORAL_TABLET | Freq: Four times a day (QID) | ORAL | Status: AC | PRN
  Administered 2011-12-08 – 2011-12-11 (×3): 25 mg via ORAL

## 2011-12-07 MED ORDER — CHLORDIAZEPOXIDE HCL 25 MG PO CAPS
25.0000 mg | ORAL_CAPSULE | Freq: Four times a day (QID) | ORAL | Status: AC
  Administered 2011-12-07 – 2011-12-08 (×6): 25 mg via ORAL
  Filled 2011-12-07 (×6): qty 1

## 2011-12-07 MED ORDER — HYDROCHLOROTHIAZIDE 25 MG PO TABS
25.0000 mg | ORAL_TABLET | Freq: Every day | ORAL | Status: DC
  Administered 2011-12-08 – 2011-12-13 (×5): 25 mg via ORAL
  Filled 2011-12-07 (×8): qty 1

## 2011-12-07 MED ORDER — NAPROXEN 500 MG PO TABS
500.0000 mg | ORAL_TABLET | Freq: Two times a day (BID) | ORAL | Status: AC | PRN
  Administered 2011-12-07 – 2011-12-11 (×4): 500 mg via ORAL
  Filled 2011-12-07 (×4): qty 1

## 2011-12-07 MED ORDER — MAGNESIUM HYDROXIDE 400 MG/5ML PO SUSP: 30.0000 mL | Freq: Every day | ORAL | Status: DC | PRN

## 2011-12-07 MED ORDER — ABACAVIR SULFATE-LAMIVUDINE 600-300 MG PO TABS
1.0000 | ORAL_TABLET | Freq: Every day | ORAL | Status: DC
  Filled 2011-12-07: qty 1

## 2011-12-07 MED ORDER — CHLORDIAZEPOXIDE HCL 25 MG PO CAPS
25.0000 mg | ORAL_CAPSULE | Freq: Every day | ORAL | Status: AC
  Administered 2011-12-11: 25 mg via ORAL
  Filled 2011-12-07: qty 1

## 2011-12-07 MED ORDER — ACETAMINOPHEN 325 MG PO TABS: 650.0000 mg | ORAL_TABLET | Freq: Four times a day (QID) | ORAL | Status: DC | PRN

## 2011-12-07 MED ORDER — DICYCLOMINE HCL 20 MG PO TABS
20.0000 mg | ORAL_TABLET | Freq: Four times a day (QID) | ORAL | Status: AC | PRN
  Administered 2011-12-07: 20 mg via ORAL
  Filled 2011-12-07: qty 1

## 2011-12-07 MED ORDER — LISINOPRIL 20 MG PO TABS
20.0000 mg | ORAL_TABLET | Freq: Every day | ORAL | Status: DC
  Administered 2011-12-08 – 2011-12-13 (×5): 20 mg via ORAL
  Filled 2011-12-07 (×8): qty 1

## 2011-12-07 MED ORDER — VITAMIN B-1 100 MG PO TABS
100.0000 mg | ORAL_TABLET | Freq: Every day | ORAL | Status: DC
  Administered 2011-12-08 – 2011-12-13 (×6): 100 mg via ORAL
  Filled 2011-12-07 (×8): qty 1

## 2011-12-07 MED ORDER — CHLORDIAZEPOXIDE HCL 25 MG PO CAPS
25.0000 mg | ORAL_CAPSULE | Freq: Three times a day (TID) | ORAL | Status: AC
  Administered 2011-12-09 (×3): 25 mg via ORAL
  Filled 2011-12-07 (×3): qty 1

## 2011-12-07 NOTE — BHH Counselor (Signed)
Pt. Accepted to [facility].  Staff completed Admission paperwork, notified charge nurse and ER Physician.  Bed 505-1. By [place] to [place].

## 2011-12-07 NOTE — BH Assessment (Signed)
Assessment Note   Russell [name] is an 61 y.o. male.  Patient was very sleepy when this clinician talked with him.  Patient states that he understands he is pending a bed at [facility].  Patient said that there was no change in his intention to get help at [facility].  Pt still denies SI, Hi or A/V hallucinations.  He is pending a 300 hall bed at [facility]. Previous Note: Russell [name] is an 61 y.o. male that was assessed this day. Pt is self-referred seeking detox from heroin and ETOH. Pt reported he relapsed in January of this year and his use became worse after the death of his mother in 03-23-2022 and his sister-in-law soon thereafter. Pt reports he drinks 1 pint of wine daily, last use this morning and pt drank one pint. Pt reports using 4-5 bags of heroin a day, last use was 3 bags yesterday on 12/04/11. Pt stated his use is affecting his finances and he was unable to pay his bills. Pt also reports he wants to get better physically, as he has several medical problems. Pt has had detox once years ago and no other MH or SA treatment. Pt does endorse sx of depression and is suffering from bereavement. Pt pleasant, cooperative and wants detox. Pt denies SI/HI or psychosis. Pt denies seizure hx. Completed assessment, assessment notification and faxed to [facility] to run for possible admission. Updated ED staff.  Axis I: Alcohol Abuse, Bereavement, Major Depression, Recurrent severe and 304.00 Opioid Dependence Axis II: Deferred Axis III:  Past Medical History  Diagnosis Date  . Abdominal aortic aneurysm   . Hypertension   . Renal cell cancer   . Hepatitis C carrier   . Chronic renal insufficiency, stage II (mild)   . HIV (human immunodeficiency virus infection)   . Chronic hepatitis C    Axis IV: economic problems, housing problems, occupational problems and problems related to social environment Axis V: 31-40 impairment in reality testing  Past Medical History:  Past Medical History  Diagnosis Date  . Abdominal  aortic aneurysm   . Hypertension   . Renal cell cancer   . Hepatitis C carrier   . Chronic renal insufficiency, stage II (mild)   . HIV (human immunodeficiency virus infection)   . Chronic hepatitis C     Past Surgical History  Procedure Date  . Hernia repair   . Abdominal aortic aneurysm repair     Family History: No family history on file.  Social History:  reports that he has been smoking.  He does not have any smokeless tobacco history on file. He reports that he drinks alcohol. He reports that he uses illicit drugs (IV).  Additional Social History:  Alcohol / Drug Use Pain Medications: see MAR Prescriptions: see MAR Over the Counter: see MAR History of alcohol / drug use?: Yes Substance #1 Name of Substance 1: Heroin 1 - Age of First Use: 25 1 - Amount (size/oz): 4-5 bags 1 - Frequency: daily 1 - Duration: ongoing on and off for years 1 - Last Use / Amount: 12/04/11 at 0100 - 3 bags Substance #2 Name of Substance 2: ETOH - wine 2 - Age of First Use: 22 2 - Amount (size/oz): 1 pint 2 - Frequency: daily 2 - Duration: Ongoing since 03/03/132 - Last Use / Amount: 12/04/12 - 1 pint this AM  CIWA: CIWA-Ar BP: 161/91 mmHg Pulse Rate: 77  Nausea and Vomiting: no nausea and no vomiting Tactile Disturbances: none Tremor: no  tremor Auditory Disturbances: not present Paroxysmal Sweats: no sweat visible Visual Disturbances: not present Anxiety: no anxiety, at ease Headache, Fullness in Head: none present Agitation: normal activity Orientation and Clouding of Sensorium: oriented and can do serial additions CIWA-Ar Total: 0  COWS: Clinical Opiate Withdrawal Scale (COWS) Resting Pulse Rate: Pulse Rate 80 or below Sweating: No report of chills or flushing Restlessness: Able to sit still Pupil Size: Pupils pinned or normal size for room light Bone or Joint Aches: Not present Runny Nose or Tearing: Not present GI Upset: nausea or loose stool Tremor: No tremor Yawning:  No yawning Anxiety or Irritability: None Gooseflesh Skin: Skin is smooth COWS Total Score: 2   Allergies: No Known Allergies  Home Medications:  (Not in a hospital admission)  OB/GYN Status:  No LMP for male patient.  General Assessment Data Location of Assessment: [facility] ED Living Arrangements: Other relatives (Lives with sister) Can pt return to current living arrangement?: Yes Admission Status: Voluntary Is patient capable of signing voluntary admission?: Yes Transfer from: Acute Hospital Referral Source: Self/Family/Friend  Education Status Is patient currently in school?: No  Risk to self Suicidal Ideation: No Suicidal Intent: No Is patient at risk for suicide?: No Suicidal Plan?: No Access to Means: No What has been your use of drugs/alcohol within the last 12 months?: Using ETOH and heroin Previous Attempts/Gestures: No How many times?: 0  Other Self Harm Risks: Pt denies Triggers for Past Attempts: None known Intentional Self Injurious Behavior: None Family Suicide History: No Recent stressful life event(s): Turmoil (Comment);Other (Comment) (Grief, medical issues, ongoing SA) Persecutory voices/beliefs?: No Depression: Yes Depression Symptoms: Despondent;Guilt;Loss of interest in usual pleasures;Feeling worthless/self pity Substance abuse history and/or treatment for substance abuse?: Yes Suicide prevention information given to non-admitted patients: Not applicable  Risk to Others Homicidal Ideation: No Thoughts of Harm to Others: No Current Homicidal Intent: No Current Homicidal Plan: No Access to Homicidal Means: No Identified Victim: No one History of harm to others?: No Assessment of Violence: None Noted Violent Behavior Description: N/A Does patient have access to weapons?: No Criminal Charges Pending?: No Does patient have a court date: No  Psychosis Hallucinations: None noted Delusions: None noted  Mental Status Report Appear/Hygiene: Other  (Comment) (Casual) Eye Contact: Fair Motor Activity: Freedom of movement;Unremarkable Speech: Logical/coherent;Soft Level of Consciousness: Drowsy Mood: Depressed;Sad Affect: Appropriate to circumstance Anxiety Level: None Thought Processes: Coherent;Relevant Judgement: Unimpaired Orientation: Person;Place;Time;Situation Obsessive Compulsive Thoughts/Behaviors: None  Cognitive Functioning Concentration: Normal Memory: Recent Intact;Remote Intact IQ: Average Insight: Fair Impulse Control: Poor Appetite: Fair Weight Loss:  (yes but unsure of how much) Weight Gain: 0  Sleep: Decreased Total Hours of Sleep:  (Intermittent sleep) Vegetative Symptoms: None  ADLScreening [facility] Assessment Services) Patient's cognitive ability adequate to safely complete daily activities?: Yes Patient able to express need for assistance with ADLs?: Yes Independently performs ADLs?: Yes (appropriate for developmental age)  Abuse/Neglect [facility]) Physical Abuse: Denies Verbal Abuse: Denies Sexual Abuse: Denies  Prior Inpatient Therapy Prior Inpatient Therapy: Yes Prior Therapy Dates: Years ago - pt cannot recall date Prior Therapy Facilty/Provider(s): [facility] Reason for Treatment: Detox  Prior Outpatient Therapy Prior Outpatient Therapy: No Prior Therapy Dates: na Prior Therapy Facilty/Provider(s): na Reason for Treatment: na  ADL Screening (condition at time of admission) Patient's cognitive ability adequate to safely complete daily activities?: Yes Patient able to express need for assistance with ADLs?: Yes Independently performs ADLs?: Yes (appropriate for developmental age) Weakness of Legs: None Weakness of Arms/Hands: None  Home  Assistive Devices/Equipment Home Assistive Devices/Equipment: None    Abuse/Neglect Assessment (Assessment to be complete while patient is alone) Physical Abuse: Denies Verbal Abuse: Denies Sexual Abuse: Denies Exploitation of patient/patient's resources:  Denies Self-Neglect: Denies Values / Beliefs Cultural Requests During Hospitalization: None Spiritual Requests During Hospitalization: None Consults Spiritual Care Consult Needed: No Social Work Consult Needed: No Merchant navy officer (For Healthcare) Advance Directive: Patient does not have advance directive;Patient would not like information    Additional Information 1:1 In Past 12 Months?: No CIRT Risk: No Elopement Risk: No Does patient have medical clearance?: Yes     Disposition:  Disposition Disposition of Patient: Referred to;Inpatient treatment program Type of inpatient treatment program: Adult Patient referred to: Other (Comment) (Pt pending a bed at [facility])  On Site Evaluation by:   Reviewed with Physician:     Beatriz Stallion Ray 12/07/2011 4:49 AM

## 2011-12-07 NOTE — Progress Notes (Signed)
Psychoeducational Group Note  Date: 12/07/2011 Time:  1015  Group Topic/Focus:  Identifying Needs:   The focus of this group is to help patients identify their personal needs that have been historically problematic and identify healthy behaviors to address their needs.  Participation Level:  Active  Participation Quality:  Appropriate  Affect:  Appropriate  Cognitive:  Appropriate  Insight:  Good  Engagement in Group:  Good  Additional Comments:    Jackquline Branca A  

## 2011-12-07 NOTE — Clinical Social Work Note (Signed)
[facility] Group Notes:  (Clinical Social Work)  12/07/2011   3:00-4:00PM  Summary of Progress/Problems:   The main focus of today's process group was for the patient to identify ways in which they have in the past sabotaged their own recovery and reasons they may have done this/what they received from doing it.  We then worked to identify a specific plan to avoid doing this when discharged from the hospital for this admission.  The patient expressed that he sabotages his whole life by using heroin, and that this current relapse was after the death of a family member.  He wants to go to long-term treatment, has never been before, and says he then cannot go back to the same neighborhood.  Type of Therapy:  Group Therapy - Process  Participation Level:  Active  Participation Quality:  Appropriate, Attentive and Sharing  Affect:  Appropriate  Cognitive:  Appropriate and Oriented  Insight:  Good  Engagement in Group:  Good  Engagement in Therapy:  Good  Modes of Intervention:  Clarification, Education, Limit-setting, Problem-solving, Socialization, Support and Processing   Ambrose Mantle, LCSW 12/07/2011  5:36 PM

## 2011-12-07 NOTE — Progress Notes (Signed)
Pt up at nurses' station complaining of withdrawal symptoms. Primary complaints include leg cramping, abdominal cramping and anxiety. CIWA is an 8, COWS is a 6. BP stable with slight increase in pulse. Affect anxious with congruent mood and some irritability detected. Pt given emotional support and medicated with prn's. On reassess pt is asleep. He denies any SI/HI/AVH and states, "no I'm just here to detox." Lawrence Marseilles

## 2011-12-07 NOTE — Progress Notes (Signed)
[facility] Group Notes:  (Counselor/Nursing/MHT/Case Management/Adjunct)  12/07/2011 10:15 PM  Type of Therapy:  Psychoeducational Skills  Participation Level:  Active  Participation Quality:  Appropriate  Affect:  Appropriate  Cognitive:  Alert  Insight:  Limited  Engagement in Group:  Limited  Engagement in Therapy:  Limited  Modes of Intervention:  Education  Summary of Progress/Problems: The patient verbalized that he didn't have a very good day since he had difficulty withdrawing from drugs.  His goal for tomorrow is to have his medication increased.    Russell [name] 12/07/2011, 10:15 PM

## 2011-12-07 NOTE — ED Notes (Signed)
First meeting with patient. Patient sleeping and was easily aroused and appears to have no needs at this time. Breakfast tray at bedside.

## 2011-12-07 NOTE — Progress Notes (Signed)
Patient ID: Russell [name], male   DOB: 29-Jan-1950, 61 y.o.   MRN: [id number] 12-07-11@ 1416 nursing adm note: pt came to b[facility] voluntarily with an admitting dx depressive d/o nos, opioid dependence and alcohol dependence. He has been abusing opiates and etoh. His ciwa and cow both were 6. His last use of etoh and heroin were on 12-04-11. He was having  w/d symptoms of anxiety, diarrhea, restlessnes and general body ache. He denied any si/hi/av. He had pain, a general body ache and his shoulder and is lactose intolerant. He is a smoker at 11/2 ppd and has on a nicotine patch. He has a medical hx of htn, peripheral vascular disease, kidney disease and kidney cancer. His also has hep c and is hiv positive.  His labs are as follows: uds positive for opiates; etoh =21; bmp= k 5.2 high, glusocse 111 high, bun 36 high, creatinine 1.74 high; cbc= rbc=3.63 low, hgb 10.9 low, hct 32.8 low.   tPt was pleasant/cooperative and was escorted to the 500 hall. Report was given to patty d,rn.    Emergency contact: eric Felkins at cell ph # [phone].

## 2011-12-07 NOTE — ED Notes (Signed)
Patient up to restroom with NAD.

## 2011-12-07 NOTE — Tx Team (Signed)
Initial Interdisciplinary Treatment Plan  PATIENT STRENGTHS: (choose at least two) Ability for insight Average or above average intelligence Communication skills Motivation for treatment/growth Supportive family/friends  PATIENT STRESSORS: Loss of wife * Substance abuse   PROBLEM LIST: Problem List/Patient Goals Date to be addressed Date deferred Reason deferred Estimated date of resolution  Depressive d/o nos  12-07-11           Opioid dependence  12-07-11           etoh dependence  12-07-11                              DISCHARGE CRITERIA:  Improved stabilization in mood, thinking, and/or behavior Verbal commitment to aftercare and medication compliance Withdrawal symptoms are absent or subacute and managed without 24-hour nursing intervention  PRELIMINARY DISCHARGE PLAN: Attend aftercare/continuing care group Return to previous living arrangement  PATIENT/FAMIILY INVOLVEMENT: This treatment plan has been presented to and reviewed with the patient, Russell [name], and/or family member, .  The patient and family have been given the opportunity to ask questions and make suggestions.  Valente David 12/07/2011, 2:34 PM

## 2011-12-08 DIAGNOSIS — F1020 Alcohol dependence, uncomplicated: Secondary | ICD-10-CM

## 2011-12-08 DIAGNOSIS — F1994 Other psychoactive substance use, unspecified with psychoactive substance-induced mood disorder: Secondary | ICD-10-CM

## 2011-12-08 DIAGNOSIS — F1120 Opioid dependence, uncomplicated: Principal | ICD-10-CM

## 2011-12-08 MED ORDER — LAMIVUDINE 150 MG PO TABS
300.0000 mg | ORAL_TABLET | Freq: Every day | ORAL | Status: DC
  Administered 2011-12-08 – 2011-12-13 (×6): 300 mg via ORAL
  Filled 2011-12-08 (×8): qty 2

## 2011-12-08 MED ORDER — NICOTINE 21 MG/24HR TD PT24
MEDICATED_PATCH | TRANSDERMAL | Status: AC
  Administered 2011-12-08: 21 mg via TRANSDERMAL
  Filled 2011-12-08: qty 1

## 2011-12-08 MED ORDER — ABACAVIR SULFATE 300 MG PO TABS
600.0000 mg | ORAL_TABLET | Freq: Every day | ORAL | Status: DC
  Administered 2011-12-08 – 2011-12-13 (×6): 600 mg via ORAL
  Filled 2011-12-08 (×8): qty 2

## 2011-12-08 NOTE — Progress Notes (Signed)
Psychoeducational Group Note  Date:  12/08/2011 Time:  1015  Group Topic/Focus:  Making Healthy Choices:   The focus of this group is to help patients identify negative/unhealthy choices they were using prior to admission and identify positive/healthier coping strategies to replace them upon discharge.  Participation Level:  Active  Participation Quality:  Appropriate  Affect:  Appropriate  Cognitive:  Appropriate  Insight:  Good  Engagement in Group:  Good  Additional Comments:    Dione Housekeeper 12/08/2011

## 2011-12-08 NOTE — [facility] Suicide Risk Assessment (Signed)
Suicide Risk Assessment  Admission Assessment     Nursing information obtained from:  Patient Demographic factors:  Male;Divorced or widowed;Unemployed Current Mental Status:   (denies si/hi/av) Loss Factors:  Decrease in vocational status;Decline in physical health;Financial problems / change in socioeconomic status Historical Factors:  Family history of mental illness or substance abuse;Impulsivity Risk Reduction Factors:  Sense of responsibility to family;Religious beliefs about death;Living with another person, especially a relative;Positive social support  CLINICAL FACTORS:   Depression:   Anhedonia Hopelessness Impulsivity Insomnia Recent sense of peace/wellbeing Severe Alcohol/Substance Abuse/Dependencies Unstable or Poor Therapeutic Relationship Previous Psychiatric Diagnoses and Treatments Medical Diagnoses and Treatments/Surgeries  COGNITIVE FEATURES THAT CONTRIBUTE TO RISK:  Polarized thinking    SUICIDE RISK:   Minimal: No identifiable suicidal ideation.  Patients presenting with no risk factors but with morbid ruminations; may be classified as minimal risk based on the severity of the depressive symptoms  PLAN OF CARE: Admitted to [facility] from [facility] emergency department. [place] [facility] emergently for alcohol and opiate detox treatment substance-induced depression and crisis stabilization. Patient will be receiving Librium protocol and clonidine protocol and medication treatment for hepatitis C and HIV as prescribed by outpatient doctors.   Taesha Goodell,JANARDHAHA R. 12/08/2011, 10:57 AM

## 2011-12-08 NOTE — Progress Notes (Signed)
Group Topic/Focus:  Making Healthy Choices:   The focus of this group is to help patients identify negative/unhealthy choices they were using prior to admission and identify positive/healthier coping strategies to replace them upon discharge.  Participation Level:  Active  Participation Quality:  Appropriate  Affect:  Appropriate  Cognitive:  Appropriate  Insight:  Good  Engagement in Group:  Good  Additional Comments:    Soundra Lampley A 12/08/2011   
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
## 2011-12-08 NOTE — Progress Notes (Signed)
Patient attended 1 pm psycho educational group. This group consisted of the therapeutic question ball, long and short term goals, support systems, and therapeutic coping skills that pt can use everyday.  

## 2011-12-08 NOTE — Clinical Social Work Note (Signed)
[facility] Group Notes: (Clinical Social Work)   12/08/2011  3-4pm   Type of Therapy:  Group Therapy   Participation Level:  Did Not Attend    Ambrose Mantle, LCSW 12/08/2011, 4:28 PM

## 2011-12-08 NOTE — Progress Notes (Signed)
Pt states this particular detox is the hardest he's ever experienced. "I thought the last one was bad off of black tar heroin but for some reason this is worth. And you all aren't giving me a thing that helps." Explained to pt meds would not replace the opiates and alcohol and that his discomfort is part of the process. Pt minimally receptive. Mood remains irritable, anxious. Denies SI/HI/AVH and remains safe. Lawrence Marseilles

## 2011-12-09 DIAGNOSIS — F1010 Alcohol abuse, uncomplicated: Secondary | ICD-10-CM

## 2011-12-09 DIAGNOSIS — Z21 Asymptomatic human immunodeficiency virus [HIV] infection status: Secondary | ICD-10-CM

## 2011-12-09 DIAGNOSIS — F1910 Other psychoactive substance abuse, uncomplicated: Secondary | ICD-10-CM

## 2011-12-09 MED ORDER — MIRTAZAPINE 30 MG PO TABS
30.0000 mg | ORAL_TABLET | Freq: Every day | ORAL | Status: DC
  Administered 2011-12-09 – 2011-12-12 (×4): 30 mg via ORAL
  Filled 2011-12-09: qty 3
  Filled 2011-12-09 (×5): qty 1

## 2011-12-09 MED ORDER — NABUMETONE 500 MG PO TABS
500.0000 mg | ORAL_TABLET | Freq: Two times a day (BID) | ORAL | Status: DC | PRN
  Administered 2011-12-09 – 2011-12-12 (×2): 500 mg via ORAL
  Filled 2011-12-09 (×2): qty 1

## 2011-12-09 MED ORDER — NICOTINE 21 MG/24HR TD PT24
21.0000 mg | MEDICATED_PATCH | Freq: Every day | TRANSDERMAL | Status: DC
  Administered 2011-12-08 – 2011-12-13 (×6): 21 mg via TRANSDERMAL
  Filled 2011-12-09 (×8): qty 1

## 2011-12-09 NOTE — Progress Notes (Signed)
Patient attended and participated in afternoon group.  Denied SI and HI.   Denied A/V hallucinations.  Went to dining room for dinner.

## 2011-12-09 NOTE — Progress Notes (Signed)
Psychoeducational Group Note  Date:  12/09/2011 Time:  1100  Group Topic/Focus:  Self Care:   The focus of this group is to help patients understand the importance of self-care in order to improve or restore emotional, physical, spiritual, interpersonal, and financial health.  Participation Level:  Did Not Attend   Meredith Staggers 12/09/2011, 12:21 PM

## 2011-12-09 NOTE — Tx Team (Signed)
Interdisciplinary Treatment Plan Update (Adult)  Date:  12/09/2011  Time Reviewed:  9:53 AM   Progress in Treatment: Attending groups:   Yes   Participating in groups:  Yes Taking medication as prescribed:  Yes Tolerating medication:  Yes Family/Significant othe contact made: Contact made with family Patient understands diagnosis:  Yes Discussing patient identified problems/goals with staff: Yes Medical problems stabilized or resolved: Yes Denies suicidal/homicidal ideation:Yes Issues/concerns per patient self-inventory:  Other:  New problem(s) identified:  Reason for Continuation of Hospitalization: Anxiety Depression Medication stabilization Suicidal ideation  Interventions implemented related to continuation of hospitalization:  Medication Management; safety checks q 15 mins  Additional comments:  Estimated length of stay:  2-3 days  Discharge Plan:  Home with outpatient follow up  New goal(s):  Review of initial/current patient goals per problem list:    1.  Goal(s): Eliminate SI/other thoughts of self harm   Met:  Yes  Target date: d/c  As evidenced by: Patient no longer endorsing SI/HI or other thoughts of self harm.    2.  Goal (s): Detox from Heroin  Met: Yes  Target date: d/c  As evidenced by: Patient will complete detox protocol    3.  Goal(s):.stabilize on meds   Met:  Yes  Target date: d/c  As evidenced by: Patient reports being stabilized on medications - less symptomatic    4.  Goal(s): Reduce anxiety (rated at five today)   Met:  No  Target date: d/c  As evidenced by: Patient will rate symptoms at four or below    Attendees: Patient:   12/09/2011 9:53 AM  Physican:  Patrick North, MD 12/09/2011 9:53 AM  Nursing:  Neill Loft, RN 12/09/2011 9:53 AM   Nursing:   Quintella Reichert, RN 12/09/2011 9:53 AM   Clinical Social Worker:  Juline Patch, LCSW 12/09/2011 9:53 AM   Other: Serena Colonel, FNP 12/09/2011 9:53 AM     Other:    12/09/2011 12/09/2011 Other:        12/09/2011 9:53 AM

## 2011-12-09 NOTE — Progress Notes (Signed)
Patient ID: Russell [name], male   DOB: May 12, 1950, 61 y.o.   MRN: [id number] D-Patient reports he did not sleep last night except about 20-30 minutes.  He says he can always eat, so his appetite is good.  His energy level is low and he feels dizzy.  He was unable to give a number to his depression, repeating "I don;t know"  He denies having cravings.  He does have joint pain.  A-His blood pressure was low this am and am BP meds held.  Clonidine given when recheck of BP allowed.  R-Pt in better mood as am progressed. He did go to d/c planning group with urging.
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
## 2011-12-09 NOTE — Progress Notes (Signed)
Patient ID: Dean Goldner, male   DOB: Jul 20, 1950, 61 y.o.   MRN: [id number]  PER STATE REGULATIONS 482.30  THIS CHART WAS REVIEWED FOR MEDICAL NECESSITY WITH RESPECT TO THE PATIENT'S ADMISSION/ DURATION OF STAY.  NEXT REVIEW DATE: 12/12/2011   Willa Rough, RN, BSN CASE MANAGER

## 2011-12-09 NOTE — Progress Notes (Signed)
[facility] Adult Inpatient Family/Significant Other Suicide Prevention Education  Suicide Prevention Education:  Education Completed; with patient's son Jamyson Jirak Junior at telephone number [phone] has been identified by the patient as the family member/significant other with whom the patient will be residing, and identified as the person(s) who will aid the patient in the event of a mental health crisis (suicidal ideations/suicide attempt).  With written consent from the patient, the family member/significant other has been provided the following suicide prevention education, prior to the and/or following the discharge of the patient.  The suicide prevention education provided includes the following:  Suicide risk factors  Suicide prevention and interventions  National Suicide Hotline telephone number  [facility] assessment telephone number  [facility] Emergency Assistance 911  [facility] and/or Residential Mobile Crisis Unit telephone number  Request made of family/significant other to:  Remove weapons (e.g., guns, rifles, knives), all items previously/currently identified as safety concern.    Remove drugs/medications (over-the-counter, prescriptions, illicit drugs), all items previously/currently identified as a safety concern.  The family member/significant other verbalizes understanding of the suicide prevention education information provided.  The family member/significant other agrees to remove the items of safety concern listed above.  [name] 12/09/2011, 9:40 AM

## 2011-12-09 NOTE — Progress Notes (Signed)
[facility] MD Progress Note  12/09/2011 1:22 PM Russell [name]  MRN:  [id number]  Diagnosis:   Axis I: Alcohol Abuse, Substance Abuse and Substance Induced Mood Disorder Axis II: Deferred Axis III:  Past Medical History  Diagnosis Date  . Abdominal aortic aneurysm   . Hypertension   . Renal cell cancer   . Hepatitis C carrier   . Chronic renal insufficiency, stage II (mild)   . HIV (human immunodeficiency virus infection)   . Chronic hepatitis C    Axis IV: economic problems, housing problems, occupational problems, other psychosocial or environmental problems, problems related to social environment and problems with primary support group Axis V: 41-50 serious symptoms  ADL's:  Intact  Sleep: Poor  Appetite:  Fair  Suicidal Ideation:  Denies Homicidal Ideation:  Denies  Mental Status Examination/Evaluation: Objective:  Appearance: Casual  Eye Contact::  Good  Speech:  Slow  Volume:  Decreased  Mood:  Depressed  Affect:  Depressed  Thought Process:  Goal Directed  Orientation:  Full  Thought Content:  WDL  Suicidal Thoughts:  No  Homicidal Thoughts:  No  Memory:  Immediate;   Fair Recent;   Fair Remote;   Fair  Judgement:  Impaired  Insight:  Lacking  Psychomotor Activity:  Decreased  Concentration:  Fair  Recall:  Fair  Akathisia:  No  Handed:  Right  AIMS (if indicated):     Assets:  Desire for Improvement  Sleep:  Number of Hours: 5.75    Vital Signs:Blood pressure 104/71, pulse 89, temperature 97.6 F (36.4 C), temperature source Oral, resp. rate 18, height 5\' 7"  (1.702 m), weight 68.04 kg (150 lb), SpO2 99.00%. Current Medications: Current Facility-Administered Medications  Medication Dose Route Frequency Provider Last Rate Last Dose  . abacavir (ZIAGEN) tablet 600 mg  600 mg Oral Daily Rachael Fee, MD   600 mg at 12/09/11 4098   And  . lamiVUDine (EPIVIR) tablet 300 mg  300 mg Oral Daily Rachael Fee, MD   300 mg at 12/09/11 1191  . acetaminophen  (TYLENOL) tablet 650 mg  650 mg Oral Q6H PRN Verne Spurr, PA-C      . alum & mag hydroxide-simeth (MAALOX/MYLANTA) 200-200-20 MG/5ML suspension 30 mL  30 mL Oral Q4H PRN Verne Spurr, PA-C      . chlordiazePOXIDE (LIBRIUM) capsule 25 mg  25 mg Oral Q6H PRN Verne Spurr, PA-C   25 mg at 12/07/11 2044  . [COMPLETED] chlordiazePOXIDE (LIBRIUM) capsule 25 mg  25 mg Oral QID Verne Spurr, PA-C   25 mg at 12/08/11 2149   Followed by  . chlordiazePOXIDE (LIBRIUM) capsule 25 mg  25 mg Oral TID Verne Spurr, PA-C   25 mg at 12/09/11 1146   Followed by  . chlordiazePOXIDE (LIBRIUM) capsule 25 mg  25 mg Oral BH-qamhs Verne Spurr, PA-C       Followed by  . chlordiazePOXIDE (LIBRIUM) capsule 25 mg  25 mg Oral Daily Verne Spurr, PA-C      . cloNIDine (CATAPRES) tablet 0.1 mg  0.1 mg Oral QID Verne Spurr, PA-C   0.1 mg at 12/09/11 1146   Followed by  . cloNIDine (CATAPRES) tablet 0.1 mg  0.1 mg Oral BH-qamhs Verne Spurr, PA-C   0.1 mg at 12/07/11 2334   Followed by  . cloNIDine (CATAPRES) tablet 0.1 mg  0.1 mg Oral QAC breakfast Verne Spurr, PA-C      . dicyclomine (BENTYL) tablet 20 mg  20 mg Oral Q6H PRN  Verne Spurr, PA-C   20 mg at 12/07/11 2044  . efavirenz (SUSTIVA) tablet 600 mg  600 mg Oral QHS Verne Spurr, PA-C   600 mg at 12/08/11 2149  . lisinopril (PRINIVIL,ZESTRIL) tablet 20 mg  20 mg Oral Daily Rachael Fee, MD   20 mg at 12/08/11 6387   And  . hydrochlorothiazide (HYDRODIURIL) tablet 25 mg  25 mg Oral Daily Rachael Fee, MD   25 mg at 12/08/11 5643  . hydrOXYzine (ATARAX/VISTARIL) tablet 25 mg  25 mg Oral Q6H PRN Verne Spurr, PA-C   25 mg at 12/08/11 0155  . loperamide (IMODIUM) capsule 2-4 mg  2-4 mg Oral PRN Verne Spurr, PA-C      . magnesium hydroxide (MILK OF MAGNESIA) suspension 30 mL  30 mL Oral Daily PRN Verne Spurr, PA-C      . methocarbamol (ROBAXIN) tablet 500 mg  500 mg Oral Q8H PRN Verne Spurr, PA-C   500 mg at 12/07/11 2044  . multivitamin with  minerals tablet 1 tablet  1 tablet Oral Daily Verne Spurr, PA-C   1 tablet at 12/09/11 0810  . naproxen (NAPROSYN) tablet 500 mg  500 mg Oral BID PRN Verne Spurr, PA-C   500 mg at 12/09/11 0818  . nicotine (NICODERM CQ - dosed in mg/24 hours) patch 21 mg  21 mg Transdermal Q0600 Himabindu Ravi, MD   21 mg at 12/09/11 0947  . ondansetron (ZOFRAN-ODT) disintegrating tablet 4 mg  4 mg Oral Q6H PRN Verne Spurr, PA-C      . thiamine (B-1) injection 100 mg  100 mg Intramuscular Once [employer], PA-C      . thiamine (VITAMIN B-1) tablet 100 mg  100 mg Oral Daily Verne Spurr, PA-C   100 mg at 12/09/11 3295    Lab Results: No results found for this or any previous visit (from the past 48 hour(s)).  Physical Findings: AIMS: Facial and Oral Movements Muscles of Facial Expression: None, normal Lips and Perioral Area: None, normal Jaw: None, normal Tongue: None, normal,Extremity Movements Upper (arms, wrists, hands, fingers): None, normal Lower (legs, knees, ankles, toes): None, normal, Trunk Movements Neck, shoulders, hips: None, normal, Overall Severity Severity of abnormal movements (highest score from questions above): None, normal Incapacitation due to abnormal movements: None, normal Patient's awareness of abnormal movements (rate only patient's report): No Awareness, Dental Status Current problems with teeth and/or dentures?: No Does patient usually wear dentures?: No  CIWA:  CIWA-Ar Total: 3  COWS:  COWS Total Score: 5   Treatment Plan Summary: Daily contact with patient to assess and evaluate symptoms and progress in treatment Medication management  Plan:  Patient complaining of aches and pains related to withdrawal--encouraged him to ask for his PRN medications, consult with MD about medication adjustments to better meet his needs, denies suicidal/homicidal ideations and auditory and visual hallucinations, group attendance encouraged (compliant with treatment) close monitoring  continues  Karmine Kauer, Catha Nottingham, NP 12/09/2011, 1:22 PM

## 2011-12-09 NOTE — Clinical Social Work Note (Signed)
Discharge Planning Group 8:30-9:30 AM  12/09/2011  Patient seen during d/c planning group.  He advised of admitting to hospital to discharge from Heroin.  He denies SI/HI.  Patient rates  depression at zero and anxiety at five.       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  Patient shared he had been clean for seven years prior to death of parents.  He states he no longer has a motivation.  Patient states he is hopeful to get in a program in [place] where he can stay close to family.   [name] 12/09/2011 12:26 PM

## 2011-12-10 NOTE — Progress Notes (Signed)
Nutrition Brief Note  Patient identified on the Malnutrition Screening Tool (MST) Report  Body mass index is 23.49 kg/(m^2). Pt meets criteria for wnl based on current BMI.   Current diet order is Regular.  Labs and medications reviewed.   No nutrition interventions warranted at this time. Pt resting at time of visit.  Note pt with improved appetite and increased interest in self care.  If nutrition issues arise, please consult RD.   Loyce Dys, MS RD LDN Clinical Inpatient Dietitian Pager: [phone] Weekend/After hours pager: [phone]

## 2011-12-10 NOTE — Progress Notes (Signed)
[facility] Group Notes:  (Counselor/Nursing/MHT/Case Management/Adjunct)  12/10/2011 8:15PM  Type of Therapy:  Group Therapy  Participation Level:  Active  Participation Quality:  Appropriate  Affect:  Appropriate  Cognitive:  Alert and Oriented  Insight:  Good  Engagement in Group:  Good  Engagement in Therapy:  Good  Modes of Intervention:  Clarification, Problem-solving and Support  Summary of Progress/Problems: Pt stated that he learned the difference between binging and being an addict. Pt reported that he has a very healthy family support. Pt also verbalized that he would like to talk to someone about grief and long-term placement.  Zema Lizardo, Randal Buba 12/10/2011, 9:54 PM

## 2011-12-10 NOTE — Clinical Social Work Note (Signed)
[facility] Group Notes:  (Counselor/Nursing/MHT/Case Management/Adjunct)      Feelings about Diagnosis    12/10/2011   1:15   Type of Therapy: Group  Participation Level:  Active  Participation Quality:  Attentive  Affect:  Appropriate  Cognitive:  Alert and Appropriate  Insight:  Minimal  Engagement in Group:  Good  Engagement in Therapy:  Minimal  Modes of Intervention:  Education, Problem-solving, Support and Exploration  Summary of Progress/Problems:  Russell [name] stated that he is a binge addict and that his usage is triggered by grief.  He was unable to see that using for a several months is more than binging.   [name] 12/10/2011 4:14 PM

## 2011-12-10 NOTE — Progress Notes (Signed)
Psychoeducational Group Note  Date:  12/10/2011 Time:  2000  Group Topic/Focus:  Wrap-Up Group:   The focus of this group is to help patients review their daily goal of treatment and discuss progress on daily workbooks.  Participation Level:  Active  Participation Quality:  Appropriate, Attentive and Sharing  Affect:  Flat  Cognitive:  Appropriate  Insight:  Good  Engagement in Group:  Good  Additional Comments:  Patient shared that he is not ready to leave the hospital because he still wants to go out into the world and use drugs. Patient shared that he wants to get back to being focused and grateful for his ten grandchildren.  Jossue Rubenstein, Newton Pigg 12/10/2011, 1:14 AM

## 2011-12-10 NOTE — Progress Notes (Addendum)
D:  Patient's self inventory sheet, patient has poor sleep, good appetite, low energy level, improving attention span.  Rated depression #5, rated anxiety #10, denied hopelessness.  Has experienced agitation.  Denied SI.  Physical problems in last 24 hours joint pain, left shoulder problems 5 years, dizziness, feels weak.   Patient wants long term treatment.  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  Does not have discharge plans.  No problems taking medications after discharge. A:  Medications per MD order.  Support and encouragement given throughout day.  Support and safety checks completed as ordered. R:  Following treatment plan.  Denied SI and HI.  Denied A/V hallucinations.  Contracts for safety.  Patient remains safe and receptive on unit. Patient asked about his sleep medications.   Stated he wanted every medication he could get tonight for sleep.  1438  Patient sitting in hallway, reading, stated he was feeling good now.  Talking and laughing with peers this afternoon. Patient attended one group today.

## 2011-12-10 NOTE — Progress Notes (Signed)
D: Patient in dayroom on approach.  Patient states he has been sleeping during the day and unable to sleep during the night.  Patient states he is depressed. Patient visible and interacting with peers on the unit. Patient denies SI/HI and denies AVH.     A: Staff to monitor Q 15 mins for safety.  Encouragement and support offered.  Scheduled medications administered per orders.   R: Patient remains safe on the unit.  Patient attended group tonight.  Patient cooperative and taking medications.

## 2011-12-10 NOTE — Progress Notes (Signed)
Psychoeducational Group Note  Date:  12/10/2011 Time:  1100  Group Topic/Focus:  Recovery Goals:   The focus of this group is to identify appropriate goals for recovery and establish a plan to achieve them.  Participation Level: Did Not Attend  Participation Quality:  Not Applicable  Affect:  Not Applicable  Cognitive:  Not Applicable  Insight:  Not Applicable  Engagement in Group: Not Applicable  Additional Comments:  Patient did not attend group, patient remained in bed.  Karleen Hampshire Brittini 12/10/2011, 2:23 PM

## 2011-12-10 NOTE — Progress Notes (Signed)
D: Pt quiet during shift; attended groups but spoke little.  Affect and mood remain depressed/sad.  Facial expression is sad with brief and minimal eye contact.  Interacts appropriately with peers and staff, cooperative and pleasant, no evidence of disruption in thought process or content.  Denies SI/HI, AVH, and complaints related to opiate or ETOH withdrawal.  Contracting for safety.  Reported back pain (8/10) at approximately 2050.  A: Pt encouraged to increase socialization with peers and to approach staff with any needs or issues.  Back pain medicated with Naproxen 500mg  PO and Relafen 500mg  PO @2102 ; Pt also given Vistaril 25mg  PO at 2102.  All medications administered according to med orders and POC.  Q15 minute safety checks maintained as per unit policy.  Pt continues on Librium protocol.  R: Pt remains somewhat isolative although does respond to overtures from peers and will join activities.  Safety maintained. Dion Saucier RN

## 2011-12-10 NOTE — Clinical Social Work Note (Signed)
Aftercare Planning Group: 12/10/2011 Patient did not attend after care planning group today reporting he did not feel well enough.     [name], LCSW

## 2011-12-10 NOTE — Progress Notes (Signed)
[facility] MD Progress Note  12/10/2011 1:07 PM Russell [name]  MRN:  [id number]  Diagnosis:   Axis I: Alcohol Abuse, Substance Abuse and Substance Induced Mood Disorder Axis II: Deferred Axis III:  Past Medical History  Diagnosis Date  . Abdominal aortic aneurysm   . Hypertension   . Renal cell cancer   . Hepatitis C carrier   . Chronic renal insufficiency, stage II (mild)   . HIV (human immunodeficiency virus infection)   . Chronic hepatitis C    Axis IV: economic problems, occupational problems, other psychosocial or environmental problems, problems related to social environment and problems with primary support group Axis V: 41-50 serious symptoms  ADL's:  Intact  Sleep: Fair  Appetite:  Fair  Suicidal Ideation:  Denies Homicidal Ideation:  Denies  Mental Status Examination/Evaluation: Objective:  Appearance: Casual  Eye Contact::  Fair  Speech:  Slow  Volume:  Decreased  Mood:  Depressed  Affect:  Depressed  Thought Process:  Linear and Logical  Orientation:  Full  Thought Content:  WDL  Suicidal Thoughts:  No  Homicidal Thoughts:  No  Memory:  Immediate;   Fair Recent;   Fair Remote;   Fair  Judgement:  Fair  Insight:  Fair  Psychomotor Activity:  Normal  Concentration:  Fair  Recall:  Fair  Akathisia:  No  Handed:  Right  AIMS (if indicated):     Assets:  Resilience  Sleep:  Number of Hours: 5.25    Vital Signs:Blood pressure 157/95, pulse 84, temperature 97.9 F (36.6 C), temperature source Oral, resp. rate 16, height 5\' 7"  (1.702 m), weight 68.04 kg (150 lb), SpO2 99.00%. Current Medications: Current Facility-Administered Medications  Medication Dose Route Frequency Provider Last Rate Last Dose  . abacavir (ZIAGEN) tablet 600 mg  600 mg Oral Daily Rachael Fee, MD   600 mg at 12/10/11 1478   And  . lamiVUDine (EPIVIR) tablet 300 mg  300 mg Oral Daily Rachael Fee, MD   300 mg at 12/10/11 0829  . acetaminophen (TYLENOL) tablet 650 mg  650 mg Oral  Q6H PRN Verne Spurr, PA-C      . alum & mag hydroxide-simeth (MAALOX/MYLANTA) 200-200-20 MG/5ML suspension 30 mL  30 mL Oral Q4H PRN Verne Spurr, PA-C      . chlordiazePOXIDE (LIBRIUM) capsule 25 mg  25 mg Oral Q6H PRN Verne Spurr, PA-C   25 mg at 12/07/11 2044  . [COMPLETED] chlordiazePOXIDE (LIBRIUM) capsule 25 mg  25 mg Oral TID Verne Spurr, PA-C   25 mg at 12/09/11 1816   Followed by  . chlordiazePOXIDE (LIBRIUM) capsule 25 mg  25 mg Oral BH-qamhs Verne Spurr, PA-C   25 mg at 12/10/11 0830   Followed by  . chlordiazePOXIDE (LIBRIUM) capsule 25 mg  25 mg Oral Daily Verne Spurr, PA-C      . [EXPIRED] cloNIDine (CATAPRES) tablet 0.1 mg  0.1 mg Oral QID Verne Spurr, PA-C   0.1 mg at 12/09/11 1146   Followed by  . cloNIDine (CATAPRES) tablet 0.1 mg  0.1 mg Oral BH-qamhs Verne Spurr, PA-C   0.1 mg at 12/10/11 0831   Followed by  . cloNIDine (CATAPRES) tablet 0.1 mg  0.1 mg Oral QAC breakfast Verne Spurr, PA-C      . dicyclomine (BENTYL) tablet 20 mg  20 mg Oral Q6H PRN Verne Spurr, PA-C   20 mg at 12/07/11 2044  . efavirenz (SUSTIVA) tablet 600 mg  600 mg Oral QHS Verne Spurr, PA-C  600 mg at 12/09/11 2102  . lisinopril (PRINIVIL,ZESTRIL) tablet 20 mg  20 mg Oral Daily Rachael Fee, MD   20 mg at 12/10/11 1610   And  . hydrochlorothiazide (HYDRODIURIL) tablet 25 mg  25 mg Oral Daily Rachael Fee, MD   25 mg at 12/10/11 9604  . hydrOXYzine (ATARAX/VISTARIL) tablet 25 mg  25 mg Oral Q6H PRN Verne Spurr, PA-C   25 mg at 12/09/11 2102  . loperamide (IMODIUM) capsule 2-4 mg  2-4 mg Oral PRN Verne Spurr, PA-C      . magnesium hydroxide (MILK OF MAGNESIA) suspension 30 mL  30 mL Oral Daily PRN Verne Spurr, PA-C      . methocarbamol (ROBAXIN) tablet 500 mg  500 mg Oral Q8H PRN Verne Spurr, PA-C   500 mg at 12/07/11 2044  . mirtazapine (REMERON) tablet 30 mg  30 mg Oral QHS Nanine Means, NP   30 mg at 12/09/11 2102  . multivitamin with minerals tablet 1 tablet  1 tablet  Oral Daily Verne Spurr, PA-C   1 tablet at 12/10/11 5409  . nabumetone (RELAFEN) tablet 500 mg  500 mg Oral BID PRN Nanine Means, NP   500 mg at 12/09/11 2102  . naproxen (NAPROSYN) tablet 500 mg  500 mg Oral BID PRN Verne Spurr, PA-C   500 mg at 12/09/11 2056  . nicotine (NICODERM CQ - dosed in mg/24 hours) patch 21 mg  21 mg Transdermal Q0600 Himabindu Ravi, MD   21 mg at 12/10/11 0647  . ondansetron (ZOFRAN-ODT) disintegrating tablet 4 mg  4 mg Oral Q6H PRN Verne Spurr, PA-C      . thiamine (B-1) injection 100 mg  100 mg Intramuscular Once [employer], PA-C      . thiamine (VITAMIN B-1) tablet 100 mg  100 mg Oral Daily Verne Spurr, PA-C   100 mg at 12/10/11 8119    Lab Results: No results found for this or any previous visit (from the past 48 hour(s)).  Physical Findings: AIMS: Facial and Oral Movements Muscles of Facial Expression: None, normal Lips and Perioral Area: None, normal Jaw: None, normal Tongue: None, normal,Extremity Movements Upper (arms, wrists, hands, fingers): None, normal Lower (legs, knees, ankles, toes): None, normal, Trunk Movements Neck, shoulders, hips: None, normal, Overall Severity Severity of abnormal movements (highest score from questions above): None, normal Incapacitation due to abnormal movements: None, normal Patient's awareness of abnormal movements (rate only patient's report): No Awareness, Dental Status Current problems with teeth and/or dentures?: No Does patient usually wear dentures?: No  CIWA:  CIWA-Ar Total: 1  COWS:  COWS Total Score: 5   Treatment Plan Summary: Daily contact with patient to assess and evaluate symptoms and progress in treatment Medication management  Plan:  Individual and group therapy, medication managed to promote sleep and withdrawal side effects, patient actively attending goups  Nanine Means, PMH-NP 12/10/2011, 1:07 PM

## 2011-12-11 MED ORDER — TRAZODONE HCL 50 MG PO TABS
50.0000 mg | ORAL_TABLET | Freq: Every evening | ORAL | Status: DC | PRN
  Filled 2011-12-11: qty 3

## 2011-12-11 NOTE — Progress Notes (Signed)
Psychoeducational Group Note  Date:  12/11/2011 Time:  1100  Group Topic/Focus:  Overcoming Stress:   The focus of this group is to define stress and help patients assess their triggers.  Participation Level:  Active  Participation Quality:  Appropriate, Attentive and Sharing  Affect:  Appropriate  Cognitive:  Appropriate  Insight:  Good  Engagement in Group:  Good  Additional Comments:  Patient attended personal choices and values group. Patient explained what were positive and negative choices and values that have been made throughout lifespan. Patient then completed worksheets on identifying values and choosing a value oriented life.   Karleen Hampshire Brittini 12/11/2011, 4:42 PM

## 2011-12-11 NOTE — Social Work (Signed)
Aftercare Planning Group: 12/11/2011 9:45 AM  Pt attended discharge planning group and actively participated in group.  CSW provided pt with today's workbook.  Pt presents with  poor sleep and wakes himself between 5 and 6 for both depression and anxiety. Patient says he is still shaky but denies having any suicidal ideations patient reports he is ready to leave today if he can get a bed at [facility] Group Note : Clinical Social Worker Group Therapy  12/11/2011  1:15 PM  Type of Therapy:  Group Therapy - Process Group  Participation Level:  Appropriate  Participation Quality:  Appropriate   Affect:  Blunted  Cognitive:  Alert  Insight:  Good  Engagement in Group:  Good  Engagement in Therapy:  Good  Modes of Intervention:  Clarification, Education, Problem-solving, Socialization and Support  Summary of Progress/Problems: Patient participated well in group focused on emotional regulation. Patient says the biggest problem for him is his anger and says he was raised believe it when someone takes something from him he should fight back. Patient says that he really gets angry that it takes him almost 2 days to calm himself down. Patient reports he is trying to be assertive instead of aggressive and reports the church as a good source of support for him when he starts to feel out of control. Patient reports that most of his friends are addicts and says he has had to eliminate a lot of them from his life so that his emotions don't get out of control.   [name], LCSW 12/11/2011 3:00 pm

## 2011-12-11 NOTE — Progress Notes (Signed)
[facility] Group Notes:  (Counselor/Nursing/MHT/Case Management/Adjunct)  12/11/2011 4:55 PM  Type of Therapy:  Psychoeducational Skills  Participation Level:  Minimal  Participation Quality:  Attentive  Affect:  Appropriate and Blunted  Cognitive:  Appropriate and Oriented  Insight:  Good  Engagement in Group:  Good  Engagement in Therapy:  n/a  Modes of Intervention:  Activity, Education, Problem-solving, Socialization and Support  Summary of Progress/Problems: Bretton attended a psycho-education group that focused on using quality time with support systems/individuals to engage in health coping skills. Ishmail participated in an activity guessing about self and peers. Justus was attentive but appeared to have difficulty following conversation, spoke when called on while group discussed who their supports are, how they can spend positive quality time with them as a coping skill and a way to strengthen their relationship. Hernandez was given a homework assignment to find two ways to improve his support system and twenty activities he can do to spend quality time with his supports.    Wandra Scot 12/11/2011, 4:55 PM

## 2011-12-11 NOTE — Progress Notes (Signed)
  D) Patient pleasant and cooperative upon my assessment. Patient states slept "poor," and  appetite is " good." Patient rates depression as  5 /10. Patient denies SI/HI, denies A/V hallucinations.   A) Patient offered support and encouragement, patient encouraged to discuss feelings/concerns with staff. Patient verbalized understanding. Patient monitored Q15 minutes for safety. Patient met with MD to discuss today's goals and plan of care.  R) Patient active on unit, attending groups in day room and meals in dining room.  Patient would like "long term care" once he is discharged from [facility]. Patient taking medications as ordered. Will continue to monitor.

## 2011-12-11 NOTE — Progress Notes (Signed)
Psychoeducational Group Note  Date:  12/11/2011 Time: 2000  Group Topic/Focus:  Wrap-Up Group:   The focus of this group is to help patients review their daily goal of treatment and discuss progress on daily workbooks.  Participation Level:  Minimal  Participation Quality:  Drowsy  Affect:  Blunted  Cognitive:  Appropriate  Insight:  Limited  Engagement in Group:  Limited  Additional Comments: Patient reported that he was trying to get into a group home and wanted to focus on that.  Jovon Winterhalter, Newton Pigg 12/11/2011, 9:48 PM

## 2011-12-11 NOTE — Progress Notes (Signed)
D: Patient in dayroom playing checkers on approach. Patient states he has been playing games today and it has made him have a good day.  PAtient states he has done grugs so long that it has messed up his life.  PAtient states he wants to get on the right path.  PAtient rates depression low and anxiety high.  Patient denies SI/HI and denies AVH.  A: Staff to monitor Q 15 mins for safety.  Encouragement and support offered.  Scheduled medications administered.  Vistaril administered prn for anxiety.   R:Patient remains safe on the unit.  Patient attended group tonight.  Patient calm, cooperative and taking administered medications.

## 2011-12-11 NOTE — Progress Notes (Signed)
Patient ID: Russell [name], male   DOB: 1950-02-21, 61 y.o.   MRN: [id number] [facility] MD Progress Note  12/11/2011 2:00 PM [name]  MRN:  [id number]  S: "I am not sleeping well at night. I keep waking up every 30 minutes at night. I am ready to get out of here to [facility] to start my treatment. I need to know, when am I being discharged? My mood is so - so, no suicidal thoughts".  Diagnosis:   Axis I: Alcohol Abuse, Substance Abuse and Substance Induced Mood Disorder Axis II: Deferred Axis III:  Past Medical History  Diagnosis Date  . Abdominal aortic aneurysm   . Hypertension   . Renal cell cancer   . Hepatitis C carrier   . Chronic renal insufficiency, stage II (mild)   . HIV (human immunodeficiency virus infection)   . Chronic hepatitis C    Axis IV: economic problems, occupational problems, other psychosocial or environmental problems, problems related to social environment and problems with primary support group Axis V: 41-50 serious symptoms  ADL's:  Intact  Sleep: Fair  Appetite:  Fair  Suicidal Ideation:  Denies Homicidal Ideation:  Denies  Mental Status Examination/Evaluation: Objective:  Appearance: Casual  Eye Contact::  Fair  Speech:  Slow  Volume:  Decreased  Mood:  Depressed  Affect:  Depressed  Thought Process:  Linear and Logical  Orientation:  Full  Thought Content:  Denies hallucinations and or delusions.  Suicidal Thoughts:  No  Homicidal Thoughts:  No  Memory:  Immediate;   Fair Recent;   Fair Remote;   Fair  Judgement:  Fair  Insight:  Fair  Psychomotor Activity:  Normal  Concentration:  Fair  Recall:  Fair  Akathisia:  No  Handed:  Right  AIMS (if indicated):     Assets:  Resilience  Sleep:  Number of Hours: 6.75    Vital Signs:Blood pressure 133/87, pulse 84, temperature 98.5 F (36.9 C), temperature source Oral, resp. rate 18, height 5\' 7"  (1.702 m), weight 68.04 kg (150 lb), SpO2 99.00%. Current Medications: Current  Facility-Administered Medications  Medication Dose Route Frequency Provider Last Rate Last Dose  . abacavir (ZIAGEN) tablet 600 mg  600 mg Oral Daily Rachael Fee, MD   600 mg at 12/11/11 7829   And  . lamiVUDine (EPIVIR) tablet 300 mg  300 mg Oral Daily Rachael Fee, MD   300 mg at 12/11/11 0806  . acetaminophen (TYLENOL) tablet 650 mg  650 mg Oral Q6H PRN Verne Spurr, PA-C      . alum & mag hydroxide-simeth (MAALOX/MYLANTA) 200-200-20 MG/5ML suspension 30 mL  30 mL Oral Q4H PRN Verne Spurr, PA-C      . [EXPIRED] chlordiazePOXIDE (LIBRIUM) capsule 25 mg  25 mg Oral Q6H PRN Verne Spurr, PA-C   25 mg at 12/07/11 2044  . [COMPLETED] chlordiazePOXIDE (LIBRIUM) capsule 25 mg  25 mg Oral BH-qamhs Verne Spurr, PA-C   25 mg at 12/10/11 2111   Followed by  . [COMPLETED] chlordiazePOXIDE (LIBRIUM) capsule 25 mg  25 mg Oral Daily Verne Spurr, PA-C   25 mg at 12/11/11 0809  . [COMPLETED] cloNIDine (CATAPRES) tablet 0.1 mg  0.1 mg Oral BH-qamhs Verne Spurr, PA-C   0.1 mg at 12/10/11 2112   Followed by  . cloNIDine (CATAPRES) tablet 0.1 mg  0.1 mg Oral QAC breakfast Verne Spurr, PA-C      . dicyclomine (BENTYL) tablet 20 mg  20 mg Oral Q6H PRN Verne Spurr, PA-C  20 mg at 12/07/11 2044  . efavirenz (SUSTIVA) tablet 600 mg  600 mg Oral QHS Verne Spurr, PA-C   600 mg at 12/10/11 2111  . lisinopril (PRINIVIL,ZESTRIL) tablet 20 mg  20 mg Oral Daily Rachael Fee, MD   20 mg at 12/11/11 2130   And  . hydrochlorothiazide (HYDRODIURIL) tablet 25 mg  25 mg Oral Daily Rachael Fee, MD   25 mg at 12/11/11 0807  . hydrOXYzine (ATARAX/VISTARIL) tablet 25 mg  25 mg Oral Q6H PRN Verne Spurr, PA-C   25 mg at 12/09/11 2102  . loperamide (IMODIUM) capsule 2-4 mg  2-4 mg Oral PRN Verne Spurr, PA-C      . magnesium hydroxide (MILK OF MAGNESIA) suspension 30 mL  30 mL Oral Daily PRN Verne Spurr, PA-C      . methocarbamol (ROBAXIN) tablet 500 mg  500 mg Oral Q8H PRN Verne Spurr, PA-C   500 mg at  12/07/11 2044  . mirtazapine (REMERON) tablet 30 mg  30 mg Oral QHS Nanine Means, NP   30 mg at 12/10/11 2111  . multivitamin with minerals tablet 1 tablet  1 tablet Oral Daily Verne Spurr, PA-C   1 tablet at 12/11/11 8657  . nabumetone (RELAFEN) tablet 500 mg  500 mg Oral BID PRN Nanine Means, NP   500 mg at 12/09/11 2102  . naproxen (NAPROSYN) tablet 500 mg  500 mg Oral BID PRN Verne Spurr, PA-C   500 mg at 12/11/11 1314  . nicotine (NICODERM CQ - dosed in mg/24 hours) patch 21 mg  21 mg Transdermal Q0600 Himabindu Ravi, MD   21 mg at 12/11/11 0600  . [EXPIRED] ondansetron (ZOFRAN-ODT) disintegrating tablet 4 mg  4 mg Oral Q6H PRN Verne Spurr, PA-C      . thiamine (B-1) injection 100 mg  100 mg Intramuscular Once [employer], PA-C      . thiamine (VITAMIN B-1) tablet 100 mg  100 mg Oral Daily Verne Spurr, PA-C   100 mg at 12/11/11 8469    Lab Results: No results found for this or any previous visit (from the past 48 hour(s)).  Physical Findings: AIMS: Facial and Oral Movements Muscles of Facial Expression: None, normal Lips and Perioral Area: None, normal Jaw: None, normal Tongue: None, normal,Extremity Movements Upper (arms, wrists, hands, fingers): None, normal Lower (legs, knees, ankles, toes): None, normal, Trunk Movements Neck, shoulders, hips: None, normal, Overall Severity Severity of abnormal movements (highest score from questions above): None, normal Incapacitation due to abnormal movements: None, normal Patient's awareness of abnormal movements (rate only patient's report): No Awareness, Dental Status Current problems with teeth and/or dentures?: No Does patient usually wear dentures?: No  CIWA:  CIWA-Ar Total: 0  COWS:  COWS Total Score: 0   Treatment Plan Summary: Daily contact with patient to assess and evaluate symptoms and progress in treatment Medication management  Plan:  See new changes made on the current treatment plan. Start Trazodone 50 mg Q  bedtime PRN for sleep. Continue current treatment plan. [name], PMH-NP 12/11/2011, 2:00 PM

## 2011-12-12 MED ORDER — EFAVIRENZ 200 MG PO CAPS
600.0000 mg | ORAL_CAPSULE | Freq: Every day | ORAL | Status: DC
  Administered 2011-12-12: 600 mg via ORAL
  Filled 2011-12-12 (×3): qty 3

## 2011-12-12 NOTE — Progress Notes (Signed)
Patient ID: Russell [name], male   DOB: [birth year], 61 y.o.   MRN: [id number] Patient ID: Russell [name], male   DOB: 1950-04-08, 61 y.o.   MRN: [id number] [facility] MD Progress Note  12/12/2011 1:15 PM Wallie Lagrand  MRN:  [id number]  S: "I'm still waiting for spot at [facility]. 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 My mood is okay. No suicidal or homicidal thoughts".  ROS: Negative for fever.  HENT: Negative for congestion and rhinorrhea.  Respiratory: Negative for cough, chest tightness and shortness of breath.  Cardiovascular: Negative for chest pain.  Gastrointestinal: Negative for nausea, vomiting and abdominal pain.  Skin: Negative for rash.  Neurological: Negative for weakness and headaches. Musculoskeletal: C/O pain to legs areas.  Diagnosis:   Axis I: Alcohol Abuse, Substance Abuse and Substance Induced Mood Disorder Axis II: Deferred Axis III:  Past Medical History  Diagnosis Date  . Abdominal aortic aneurysm   . Hypertension   . Renal cell cancer   . Hepatitis C carrier   . Chronic renal insufficiency, stage II (mild)   . HIV (human immunodeficiency virus infection)   . Chronic hepatitis C    Axis IV: economic problems, occupational problems, other psychosocial or environmental problems, problems related to social environment and problems with primary support group Axis V: 41-50 serious symptoms  ADL's:  Intact  Sleep: Good  Appetite:  Fair  Suicidal Ideation:  Denies Homicidal Ideation:  Denies  Mental Status Examination/Evaluation: Objective:  Appearance: Casual  Eye Contact::  Fair  Speech:  Slow  Volume:  Decreased  Mood:  "My mood is okay"  Affect:  Approriate  Thought Process:  Linear and Logical  Orientation:  Full  Thought Content:  Denies hallucinations and or delusions.  Suicidal Thoughts:  No  Homicidal Thoughts:  No  Memory:  Immediate;   Fair Recent;   Fair Remote;   Fair  Judgement:  Fair  Insight:  Fair  Psychomotor Activity:   Normal  Concentration:  Fair  Recall:  Fair  Akathisia:  No  Handed:  Right  AIMS (if indicated):     Assets:  Resilience  Sleep:  Number of Hours: 6.25    Vital Signs:Blood pressure 144/94, pulse 91, temperature 98.2 F (36.8 C), temperature source Oral, resp. rate 18, height 5\' 7"  (1.702 m), weight 68.04 kg (150 lb), SpO2 99.00%. Current Medications: Current Facility-Administered Medications  Medication Dose Route Frequency Provider Last Rate Last Dose  . abacavir (ZIAGEN) tablet 600 mg  600 mg Oral Daily Rachael Fee, MD   600 mg at 12/12/11 0865   And  . lamiVUDine (EPIVIR) tablet 300 mg  300 mg Oral Daily Rachael Fee, MD   300 mg at 12/12/11 0835  . acetaminophen (TYLENOL) tablet 650 mg  650 mg Oral Q6H PRN Verne Spurr, PA-C      . alum & mag hydroxide-simeth (MAALOX/MYLANTA) 200-200-20 MG/5ML suspension 30 mL  30 mL Oral Q4H PRN Verne Spurr, PA-C      . cloNIDine (CATAPRES) tablet 0.1 mg  0.1 mg Oral QAC breakfast Verne Spurr, PA-C   0.1 mg at 12/12/11 0641  . dicyclomine (BENTYL) tablet 20 mg  20 mg Oral Q6H PRN Verne Spurr, PA-C   20 mg at 12/07/11 2044  . efavirenz (SUSTIVA) tablet 600 mg  600 mg Oral QHS Verne Spurr, PA-C   600 mg at 12/11/11 2109  . lisinopril (PRINIVIL,ZESTRIL) tablet 20 mg  20 mg Oral Daily Rachael Fee, MD  20 mg at 12/12/11 0835   And  . hydrochlorothiazide (HYDRODIURIL) tablet 25 mg  25 mg Oral Daily Rachael Fee, MD   25 mg at 12/12/11 0835  . hydrOXYzine (ATARAX/VISTARIL) tablet 25 mg  25 mg Oral Q6H PRN Verne Spurr, PA-C   25 mg at 12/11/11 2110  . loperamide (IMODIUM) capsule 2-4 mg  2-4 mg Oral PRN Verne Spurr, PA-C      . magnesium hydroxide (MILK OF MAGNESIA) suspension 30 mL  30 mL Oral Daily PRN Verne Spurr, PA-C      . methocarbamol (ROBAXIN) tablet 500 mg  500 mg Oral Q8H PRN Verne Spurr, PA-C   500 mg at 12/12/11 0836  . mirtazapine (REMERON) tablet 30 mg  30 mg Oral QHS Nanine Means, NP   30 mg at 12/11/11 2109  .  multivitamin with minerals tablet 1 tablet  1 tablet Oral Daily Verne Spurr, PA-C   1 tablet at 12/12/11 1478  . nabumetone (RELAFEN) tablet 500 mg  500 mg Oral BID PRN Nanine Means, NP   500 mg at 12/09/11 2102  . naproxen (NAPROSYN) tablet 500 mg  500 mg Oral BID PRN Verne Spurr, PA-C   500 mg at 12/11/11 1314  . nicotine (NICODERM CQ - dosed in mg/24 hours) patch 21 mg  21 mg Transdermal Q0600 Himabindu Ravi, MD   21 mg at 12/12/11 2956  . thiamine (B-1) injection 100 mg  100 mg Intramuscular Once [employer], PA-C      . thiamine (VITAMIN B-1) tablet 100 mg  100 mg Oral Daily Verne Spurr, PA-C   100 mg at 12/12/11 0835  . traZODone (DESYREL) tablet 50 mg  50 mg Oral QHS PRN Sanjuana Kava, NP        Lab Results: No results found for this or any previous visit (from the past 48 hour(s)).  Physical Findings: AIMS: Facial and Oral Movements Muscles of Facial Expression: None, normal Lips and Perioral Area: None, normal Jaw: None, normal Tongue: None, normal,Extremity Movements Upper (arms, wrists, hands, fingers): None, normal Lower (legs, knees, ankles, toes): None, normal, Trunk Movements Neck, shoulders, hips: None, normal, Overall Severity Severity of abnormal movements (highest score from questions above): None, normal Incapacitation due to abnormal movements: None, normal Patient's awareness of abnormal movements (rate only patient's report): No Awareness, Dental Status Current problems with teeth and/or dentures?: No Does patient usually wear dentures?: No  CIWA:  CIWA-Ar Total: 0  COWS:  COWS Total Score: 0   Treatment Plan Summary: Daily contact with patient to assess and evaluate symptoms and progress in treatment Medication management  Plan: No new changes made on the current treatment regimen.  Patient may be discharged to [facility] in the morning. Reminded patient to he has something ordered for pain PRN. Continue current treatment plan. [name],  PMH-NP 12/12/2011, 1:15 PM

## 2011-12-12 NOTE — Progress Notes (Signed)
Psychoeducational Group Note  Date:  12/12/2011 Time:  1000  Group Topic/Focus:  therapeutic activity  Participation Level:  Did Not Attend  Participation Quality:    Affect:    Cognitive:    Insight:    Engagement in Group:    Additional Comments:  none  Marquis Lunch, Ryen Heitmeyer 12/12/2011, 11:22 AM

## 2011-12-12 NOTE — Progress Notes (Signed)
  D) Patient quiet but cooperative upon my assessment. Patient continues to appear sad and depressed. Patient states slept "fair," and  appetite is "poor."  Patient denies SI/HI, denies A/V hallucinations.   A) Patient offered support and encouragement, patient encouraged to discuss feelings/concerns with staff. Patient verbalized understanding. Patient monitored Q15 minutes for safety. Patient met with MD to discuss today's goals and plan of care.  R) Patient active on unit, attending groups in day room and meals in dining room.  Patient taking medications as ordered. Will continue to monitor.

## 2011-12-12 NOTE — Progress Notes (Signed)
Patient ID: Russell [name], male   DOB: Jan 26, 1950, 61 y.o.   MRN: [id number]  PER STATE REGULATIONS 482.30  THIS CHART WAS REVIEWED FOR MEDICAL NECESSITY WITH RESPECT TO THE PATIENT'S ADMISSION/ DURATION OF STAY.  NEXT REVIEW DATE: 12/15/2011  Willa Rough, RN, BSN CASE MANAGER

## 2011-12-12 NOTE — Progress Notes (Signed)
Psychoeducational Group Note  Date:  12/12/2011 Time:  2000   Group Topic/Focus:  Karaoke  Participation Level:  Minimal  Participation Quality:  Attentive  Affect:  Flat  Cognitive:  Appropriate  Insight:  Limited  Engagement in Group:  Limited  Additional Comments:    Humberto Seals Monique 12/12/2011, 10:40 PM

## 2011-12-12 NOTE — Progress Notes (Signed)
[facility] Group Notes:  (Counselor/Nursing/MHT/Case Management/Adjunct)   Living a Balanced Life 1:15-2:30   12/12/2011 3:12 PM  Type of Therapy:  Group Therapy  Participation Level:  Minimal  Participation Quality:  Appropriate and Attentive  Affect:  Appropriate  Cognitive:  Alert, Appropriate and Oriented  Insight:  Limited  Engagement in Group:  Limited  Engagement in Therapy:  Limited  Modes of Intervention:  Education, Problem-solving and Support  Summary of Progress/Problems:Patients participation during group session was limited.  He identified playing chess with others as a positive way to lead a balanced life.   Eissa Buchberger L 12/12/2011, 3:12 PM

## 2011-12-12 NOTE — Social Work (Signed)
Pt attended discharge planning group and actively participated in group.  CSW provided pt with today's workbook. Patient denies having any suicidal or homicidal ideations and rates his depression at a 5-6 level. Patient repeatedly complained of having leg pains and says this why he is depressed. Patient says he is ready to go to [facility] whenever bed becomes available to this worker called this morning and there is no bed available for today.

## 2011-12-13 MED ORDER — LISINOPRIL-HYDROCHLOROTHIAZIDE 20-25 MG PO TABS: 1.0000 | ORAL_TABLET | Freq: Every day | ORAL | Status: AC

## 2011-12-13 MED ORDER — MIRTAZAPINE 30 MG PO TABS: 30.0000 mg | ORAL_TABLET | Freq: Every day | ORAL | Status: DC

## 2011-12-13 MED ORDER — ABACAVIR SULFATE-LAMIVUDINE 600-300 MG PO TABS: 1.0000 | ORAL_TABLET | Freq: Every day | ORAL | Status: AC

## 2011-12-13 MED ORDER — EFAVIRENZ 600 MG PO TABS: 600.0000 mg | ORAL_TABLET | Freq: Every day | ORAL | Status: AC

## 2011-12-13 MED ORDER — TRAZODONE HCL 50 MG PO TABS: 50.0000 mg | ORAL_TABLET | Freq: Every evening | ORAL | Status: DC | PRN

## 2011-12-13 NOTE — Progress Notes (Signed)
Patient denies SI/HI, denies A/V hallucinations. Patient verbalizes understanding of discharge instructions, follow up care and prescriptions. Patient given all belongings from [facility] locker. Patient escorted out by staff, transported by public transportation.  

## 2011-12-13 NOTE — Social Work (Signed)
Aftercare Planning Group: 12/13/2011 9:45 AM  Pt attended discharge planning group and actively participated in group.  CSW provided pt with today's workbook.  Pt presents with improved mood and says he is ready to go. Patient reports having no depression or anxiety but admitted he would be nervous and his long-term treatment been does not become available as he is afraid he will use drugs again. Patient was encouraged to take responsibility for his drug use and to make use of his church supports if a bed does not become available at [facility], LCSW 12/13/2011 9:45 AM

## 2011-12-13 NOTE — Progress Notes (Signed)
[facility] Case Management Discharge Plan:  Will you be returning to the same living situation after discharge: Yes,    At discharge, do you have transportation home?:Yes,    Do you have the ability to pay for your medications:Yes,     Interagency Information:     Release of information consent forms completed and in the chart;  Patient's signature needed at discharge.  Patient to Follow up at:  Follow-up Information    Follow up with [facility]. On 12/16/2011. (Appointment scheduled for Monday, November 25 at 10:30 AM)    Contact information:   [address] [place], [place] [address] Telephone number [phone]      Follow up with [facility]. On 12/16/2011. (Call Monday, November 25th To check for bed availability)    Contact information:   [address] [place] [address] [phone]         Patient denies SI/HI:   Yes,       Safety Planning and Suicide Prevention discussed:  Yes,     Barrier to discharge identified:Yes,  Patient will followup with [facility] until a bed becomes available at [facility]  Summary and Recommendations: Patient is to discharge home with followup scheduled for Monday the 25th and patient was encouraged to contact [facility] for bed availability first thing Monday morning.   Russell [name] 12/13/2011, 10:52 AM

## 2011-12-13 NOTE — Discharge Summary (Signed)
Physician Discharge Summary Note  Patient:  Russell [name] is an 61 y.o., male MRN:  [id number] DOB:  1950/09/08 Patient phone:  [phone] (home)  Patient address:   [address] [place] [address],   Date of Admission:  12/07/2011 Date of Discharge: 12/13/11  Reason for Admission: Alcohol and Opiate detoxifications.  Discharge Diagnoses: Principal Problem:  *Opiate dependence Active Problems:  HIV (human immunodeficiency virus infection)  Alcohol dependence   Axis Diagnosis:   AXIS I:  Alcohol dependence, Opiate dependence AXIS II:  Deferred AXIS III:   Past Medical History  Diagnosis Date  . Abdominal aortic aneurysm   . Hypertension   . Renal cell cancer   . Hepatitis C carrier   . Chronic renal insufficiency, stage II (mild)   . HIV (human immunodeficiency virus infection)   . Chronic hepatitis C    AXIS IV:  Polysubstance abuse issues. AXIS V:  62  Level of Care:  OP  Hospital Course:  Patient presented to the [facility] emergency department and seeking detox treatment for heroin intoxication and dependence and ETOH intoxication and dependence. Patient has a long history of substance dependence and had 4-5 years of remission than relapsed occurred starting of this current year. Reportedly he relapsed in January of this year and his use became worse after the death of his mother in 04/08/2022 and his sister-in-law soon thereafter within a month. Pt reports he drinks 1 pint of wine daily, last use this morning and pt drank one pint. Patient reports using 4-5 bags of heroin a day, last use was 3 bags yesterday on 12/04/11. Patient reported dope is readily available on the street and also can get free. Patient stated his use is affecting his finances and he was unable to pay his bills. Pt has had detox one year ago at [facility] for 3 days, but relapsed immediately. Patient has no other MH or SA treatment. Patient was completed 90 day  rehabilitation treatment in [place] about 10 years ago, which was mandatory by court. Patient has a history of for driving under influence in 1992, and past legal charges of possession of drugs and felony charges. Currently, no pending legal charges. Patient has symptoms of depression and is suffering from bereavement. Patient is calm, pleasant, cooperative and requested detox. Patient has severe sometimes pain in his neck, arms, legs sweating chills, tremor, and difficulty with sleep. Patient has denied suicidal or homicidal ideation, intentions, or plans and has no evidence of psychosis. Patient has no evidence of for delirium tremens or withdrawal seizures.  Upon admission in this hospital and after admission assessment/evaluation, Mr. Russell [name] was started on both Librium and clonidine protocols for his opiate/alcohol detoxification. He was also enrolled in group counseling sessions and activities to learn coping skills that he will apply after discharge to cope, achieve and maintain a much longer sobriety. He also attended AA/NA meetings being offered and held within this unit. He has other previous and or identifiable medical conditions that required treatment and or monitoring.  He received treatment for those and was monitored closely for any potential problems that may arise as of and or during detoxification treatment. Patient tolerated his detoxification treatment without any significant adverse effects and or reactions reported.  Patient attended treatment team meeting this am and met with the team. His symptoms, substance abuse issues, response to treatment and discharge plans discussed. Patient endorsed that he is doing well and stable for discharge to pursue the next  phase of his substance abuse treatment.  It was agreed upon between patient and the team that he will be discharged to the residential treatment facility called [facility]. However, [facility] has informed the [facility] counselor that there is no bed  available for Mr. Russell [name] at this time. Patient is currently being discharged to his home in [place], [place]. He will need to call [facility] on Monday 12/16/11 starting at 06:00 am for bed availability. Patient was provided with the contact information for [facility]. If a bed becomes available by 12/16/11 or there after, Mr. Russell [name] will move in to [facility] to start an extended period of substance abuse treatment. In the mean time, he will follow-up post hospitalization care at the [facility] in [place], [place] on 12/16/11. The address, date and for this appointed provided for patient in writing.   Upon discharge, patient adamantly denies suicidal, homicidal ideations, auditory, visual hallucinations, delusional thinking and or withdrawal symptoms. Patient left [facility] with all personal belongings in no apparent distress, Transportation per family.   Consults:  None  Significant Diagnostic Studies:  CBC, CMP, Toxicology tests, UDS  Discharge Vitals:   Blood pressure 145/99, pulse 103, temperature 97.6 F (36.4 C), temperature source Oral, resp. rate 16, height 5\' 7"  (1.702 m), weight 68.04 kg (150 lb), SpO2 99.00%. Lab Results:   No results found for this or any previous visit (from the past 72 hour(s)).  Physical Findings: AIMS: Facial and Oral Movements Muscles of Facial Expression: None, normal Lips and Perioral Area: None, normal Jaw: None, normal Tongue: None, normal,Extremity Movements Upper (arms, wrists, hands, fingers): None, normal Lower (legs, knees, ankles, toes): None, normal, Trunk Movements Neck, shoulders, hips: None, normal, Overall Severity Severity of abnormal movements (highest score from questions above): None, normal Incapacitation due to abnormal movements: None, normal Patient's awareness of abnormal movements (rate only patient's report): No Awareness, Dental Status Current problems with teeth and/or dentures?: No Does patient usually wear dentures?: No  CIWA:  CIWA-Ar Total: 0  COWS:   COWS Total Score: 0   Mental Status Exam: See Mental Status Examination and Suicide Risk Assessment completed by Attending Physician prior to discharge.  Discharge destination:  Home  Is patient on multiple antipsychotic therapies at discharge:  No   Has Patient had three or more failed trials of antipsychotic monotherapy by history:  No  Recommended Plan for Multiple Antipsychotic Therapies: na     Medication List     As of 12/13/2011 10:36 AM    STOP taking these medications         traMADol 50 MG tablet   Commonly known as: ULTRAM      TAKE these medications      Indication    abacavir-lamiVUDine 600-300 MG per tablet   Commonly known as: EPZICOM   Take 1 tablet by mouth daily. For HIV infection       efavirenz 600 MG tablet   Commonly known as: SUSTIVA   Take 1 tablet (600 mg total) by mouth at bedtime. For HIV infection       lisinopril-hydrochlorothiazide 20-25 MG per tablet   Commonly known as: PRINZIDE,ZESTORETIC   Take 1 tablet by mouth daily. For high blood pressure control       mirtazapine 30 MG tablet   Commonly known as: REMERON   Take 1 tablet (30 mg total) by mouth at bedtime. For depression/sleep    Indication: Trouble Sleeping, depression      traZODone 50 MG tablet   Commonly known  as: DESYREL   Take 1 tablet (50 mg total) by mouth at bedtime as needed for sleep. For sleep          Follow-up Information    Follow up with [facility]. On 12/16/2011. (Appointment scheduled for Monday, November 25 at 10:30 AM)    Contact information:   [address] [place], [place] [address] Telephone number [phone]         Follow-up recommendations:  Activity:  as tolerated Other:  Keep all scheduled follow-up appointments as recommended.    Comments:  Take all your medications as prescribed by your mental healthcare provider. Report any adverse effects and or reactions from your medicines to your outpatient provider promptly. Patient is instructed  and cautioned to not engage in alcohol and or illegal drug use while on prescription medicines. In the event of worsening symptoms, patient is instructed to call the crisis hotline, 911 and or go to the nearest ED for appropriate evaluation and treatment of symptoms. Follow-up with your primary care provider for your other medical issues, concerns and or health care needs.     SignedArmandina Stammer I 12/13/2011, 10:36 AM

## 2011-12-13 NOTE — [facility] Suicide Risk Assessment (Signed)
Suicide Risk Assessment  Discharge Assessment     Demographic Factors:  Male, Low socioeconomic status and Unemployed  Mental Status Per Nursing Assessment::   On Admission:   (denies si/hi/av)  Current Mental Status by Physician: Patient alert and oriented to 4. Denies aH/Vh/SI/HI.  Loss Factors: Financial problems/change in socioeconomic status  Historical Factors: Impulsivity  Risk Reduction Factors:   Positive coping skills or problem solving skills  Continued Clinical Symptoms:  Alcohol/Substance Abuse/Dependencies  Cognitive Features That Contribute To Risk:  Thought constriction (tunnel vision)    Suicide Risk:  Minimal: No identifiable suicidal ideation.  Patients presenting with no risk factors but with morbid ruminations; may be classified as minimal risk based on the severity of the depressive symptoms  Discharge Diagnoses:   AXIS I:  Alcohol Abuse AXIS II:  No diagnosis AXIS III:   Past Medical History  Diagnosis Date  . Abdominal aortic aneurysm   . Hypertension   . Renal cell cancer   . Hepatitis C carrier   . Chronic renal insufficiency, stage II (mild)   . HIV (human immunodeficiency virus infection)   . Chronic hepatitis C    AXIS IV:  economic problems and occupational problems AXIS V:  61-70 mild symptoms  Plan Of Care/Follow-up recommendations:  Activity:  as tolerated Diet:  normal Follow up with outpatient appointments.  Is patient on multiple antipsychotic therapies at discharge:  No   Has Patient had three or more failed trials of antipsychotic monotherapy by history:  No  Recommended Plan for Multiple Antipsychotic Therapies: NA  Brysen Shankman 12/13/2011, 10:03 AM

## 2011-12-13 NOTE — Progress Notes (Signed)
Psychoeducational Group Note  Date:  12/13/2011 Time: 1100  Group Topic/Focus:  Therapeutic Activity- Apples to Apples  Participation Level: Did Not Attend  Participation Quality:  Not Applicable  Affect:  Not Applicable  Cognitive:  Not Applicable  Insight:  Not Applicable  Engagement in Group: Not Applicable  Additional Comments: Patient did not attend group  Karleen Hampshire Brittini 12/13/2011, 1:28 PM

## 2011-12-13 NOTE — Social Work (Signed)
Interdisciplinary Treatment Plan Update (Adult)  Date:  12/13/2011  Time Reviewed:  10:56 AM    Progress in Treatment: Attending groups:   Yes   Participating in groups:  Yes Taking medication as prescribed:  Yes Tolerating medication:  Yes Family/Significant othe contact made: Patient refused Patient understands diagnosis:  Yes Discussing patient identified problems/goals with staff: Yes Medical problems stabilized or resolved: Yes Denies suicidal/homicidal ideation: No -  Patient able to contract for safety Issues/concerns per patient self-inventory:  Other:  New problem(s) identified: Bed at [facility] IS not available on day of discharge and followup has been scheduled with [facility] until a [facility] becomes [facility].  Reason for Continuation of Hospitalization:  Interventions implemented related to continuation of hospitalization:  Medication mgement; safety checks q 15 mins; coping skills development  Additional comments:  Estimated length of stay: Discharging today  Discharge Plan:  Outpatient follow up scheduled  New goal(s):  Review of initial/current patient goals per problem list:    1.  Goal(s): Eliminate SI/other thoughts of self harm   Met:  Yes  Target date: d/c  As evidenced by: Patient will no longer endorse SI/HI or other thoughts of self harm.    2.  Goal (s):Reduce depression/anxiety  Met: Yes  Target date: d/c  As evidenced by: Patient will rate symptoms at four or below    3.  Goal(s):.stabilize on meds   Met:  Yes  Target date: d/c  As evidenced by: Patient will report being stabilized on medications - less symptomatic    4.  Goal(s): Refer for outpatient follow up   Met:  Yes  Target date: d/c  As evidenced by: Follow up appointment will be scheduled    Attendees: Patient:  Russell [name] @TD  10:56 AM  Physican:  Patrick North, MD @TD  10:56 AM  Nursing:  Joanell Rising 12/13/2011 10:56 AM  Nursing:    @TD  10:56 AM   Clinical Social Worker:  [name], LCSW @TD  10:56 AM  Other: Lynett Grimes 12/13/2011 10:56 AM   Other:         12/13/2011 10:56 AM Other:

## 2011-12-15 NOTE — Progress Notes (Signed)
Agree with assessment and plan Krosby Ritchie A. Khamora Karan, M.D. 

## 2011-12-15 NOTE — Progress Notes (Signed)
Agree with assessment and plan Gracielynn Birkel A. Lacy Taglieri, M.D. 

## 2011-12-16 NOTE — Discharge Summary (Signed)
Reviewed

## 2011-12-17 NOTE — Progress Notes (Signed)
Patient Discharge Instructions:  After Visit Summary (AVS):   Faxed to:  12/17/11 Psychiatric Admission Assessment Note:   Faxed to:  12/17/11 Suicide Risk Assessment - Discharge Assessment:   Faxed to:  12/17/11 Faxed/Sent to the Next Level Care provider:  12/17/11 Faxed to [facility] @ [phone] Faxed to [facility] @ [phone]  Russell [name], 12/17/2011, 12:49 PM

## 2015-04-28 ENCOUNTER — Emergency Department (HOSPITAL_COMMUNITY): Payer: Medicare Other

## 2015-04-28 ENCOUNTER — Encounter (HOSPITAL_COMMUNITY): Payer: Self-pay | Admitting: *Deleted

## 2015-04-28 ENCOUNTER — Emergency Department (HOSPITAL_COMMUNITY)
Admission: EM | Admit: 2015-04-28 | Discharge: 2015-04-28 | Disposition: A | Discharge status: Home / Self Care | Payer: Medicare Other | Attending: Emergency Medicine | Admitting: Emergency Medicine

## 2015-04-28 DIAGNOSIS — Y9389 Activity, other specified: Secondary | ICD-10-CM | POA: Insufficient documentation

## 2015-04-28 DIAGNOSIS — N289 Disorder of kidney and ureter, unspecified: Secondary | ICD-10-CM | POA: Diagnosis not present

## 2015-04-28 DIAGNOSIS — B20 Human immunodeficiency virus [HIV] disease: Secondary | ICD-10-CM | POA: Diagnosis not present

## 2015-04-28 DIAGNOSIS — Z79899 Other long term (current) drug therapy: Secondary | ICD-10-CM | POA: Insufficient documentation

## 2015-04-28 DIAGNOSIS — F1120 Opioid dependence, uncomplicated: Secondary | ICD-10-CM | POA: Insufficient documentation

## 2015-04-28 DIAGNOSIS — S3992XA Unspecified injury of lower back, initial encounter: Secondary | ICD-10-CM | POA: Insufficient documentation

## 2015-04-28 DIAGNOSIS — Z85528 Personal history of other malignant neoplasm of kidney: Secondary | ICD-10-CM | POA: Diagnosis not present

## 2015-04-28 DIAGNOSIS — F1310 Sedative, hypnotic or anxiolytic abuse, uncomplicated: Secondary | ICD-10-CM | POA: Insufficient documentation

## 2015-04-28 DIAGNOSIS — Y9289 Other specified places as the place of occurrence of the external cause: Secondary | ICD-10-CM | POA: Insufficient documentation

## 2015-04-28 DIAGNOSIS — I129 Hypertensive chronic kidney disease with stage 1 through stage 4 chronic kidney disease, or unspecified chronic kidney disease: Secondary | ICD-10-CM | POA: Insufficient documentation

## 2015-04-28 DIAGNOSIS — F1110 Opioid abuse, uncomplicated: Secondary | ICD-10-CM | POA: Diagnosis present

## 2015-04-28 DIAGNOSIS — F17200 Nicotine dependence, unspecified, uncomplicated: Secondary | ICD-10-CM | POA: Insufficient documentation

## 2015-04-28 DIAGNOSIS — G8929 Other chronic pain: Secondary | ICD-10-CM | POA: Insufficient documentation

## 2015-04-28 DIAGNOSIS — N182 Chronic kidney disease, stage 2 (mild): Secondary | ICD-10-CM | POA: Insufficient documentation

## 2015-04-28 DIAGNOSIS — W1839XA Other fall on same level, initial encounter: Secondary | ICD-10-CM | POA: Insufficient documentation

## 2015-04-28 DIAGNOSIS — Y998 Other external cause status: Secondary | ICD-10-CM | POA: Diagnosis not present

## 2015-04-28 DIAGNOSIS — Z8619 Personal history of other infectious and parasitic diseases: Secondary | ICD-10-CM | POA: Diagnosis not present

## 2015-04-28 LAB — RAPID URINE DRUG SCREEN, HOSP PERFORMED
Amphetamines: NOT DETECTED
Barbiturates: NOT DETECTED
Benzodiazepines: POSITIVE — AB
Cocaine: NOT DETECTED
Opiates: POSITIVE — AB
Tetrahydrocannabinol: NOT DETECTED

## 2015-04-28 LAB — CBC
HCT: 32.4 % — ABNORMAL LOW (ref 39.0–52.0)
Hemoglobin: 10.3 g/dL — ABNORMAL LOW (ref 13.0–17.0)
MCH: 27 pg (ref 26.0–34.0)
MCHC: 31.8 g/dL (ref 30.0–36.0)
MCV: 85 fL (ref 78.0–100.0)
PLATELETS: 168 10*3/uL (ref 150–400)
RBC: 3.81 MIL/uL — ABNORMAL LOW (ref 4.22–5.81)
RDW: 14.8 % (ref 11.5–15.5)
WBC: 5.4 10*3/uL (ref 4.0–10.5)

## 2015-04-28 LAB — ETHANOL

## 2015-04-28 LAB — COMPREHENSIVE METABOLIC PANEL
ALT: 8 U/L — AB (ref 17–63)
AST: 19 U/L (ref 15–41)
Albumin: 2.9 g/dL — ABNORMAL LOW (ref 3.5–5.0)
Alkaline Phosphatase: 130 U/L — ABNORMAL HIGH (ref 38–126)
Anion gap: 12 (ref 5–15)
BILIRUBIN TOTAL: 0.5 mg/dL (ref 0.3–1.2)
BUN: 29 mg/dL — AB (ref 6–20)
CO2: 18 mmol/L — ABNORMAL LOW (ref 22–32)
CREATININE: 2.49 mg/dL — AB (ref 0.61–1.24)
Calcium: 8.5 mg/dL — ABNORMAL LOW (ref 8.9–10.3)
Chloride: 107 mmol/L (ref 101–111)
GFR, EST AFRICAN AMERICAN: 30 mL/min — AB (ref >60)
GFR, EST NON AFRICAN AMERICAN: 26 mL/min — AB (ref >60)
Glucose, Bld: 103 mg/dL — ABNORMAL HIGH (ref 65–99)
Potassium: 4.5 mmol/L (ref 3.5–5.1)
Sodium: 137 mmol/L (ref 135–145)
TOTAL PROTEIN: 6.5 g/dL (ref 6.5–8.1)

## 2015-04-28 LAB — ACETAMINOPHEN LEVEL: Acetaminophen (Tylenol), Serum: <10 ug/mL — ABNORMAL LOW (ref 10–30)

## 2015-04-28 LAB — CBG MONITORING, ED: GLUCOSE-CAPILLARY: 94 mg/dL (ref 65–99)

## 2015-04-28 LAB — SALICYLATE LEVEL

## 2015-04-28 NOTE — ED Notes (Signed)
Marijean Bravo, [place] counselor, called - [place] recommendation is for outpatient resources to a rehab facility. MD aware.

## 2015-04-28 NOTE — ED Provider Notes (Signed)
CSN: [id number]     Arrival date & time 04/28/15  1713 History   First MD Initiated Contact with Patient 04/28/15 1942     Chief Complaint  Patient presents with  . Psychiatric Evaluation  . Back Pain      HPI Pt to ED for multiple complaints - original complaint, per pt is detox from heroin. As MD enters room, pt begins complaining about chronic back pain, stating he fell this morning, as well hernia pain. Patient states last heroin use was this morning. Past Medical History  Diagnosis Date  . Abdominal aortic aneurysm ([place])   . Hypertension   . Renal cell cancer ([place])   . Hepatitis C carrier   . Chronic renal insufficiency, stage II (mild)   . HIV (human immunodeficiency virus infection) ([place])   . Chronic hepatitis C [facility])    Past Surgical History  Procedure Laterality Date  . Hernia repair    . Abdominal aortic aneurysm repair     No family history on file. Social History  Substance Use Topics  . Smoking status: Current Every Day Smoker -- 1.50 packs/day for 2 years  . Smokeless tobacco: None  . Alcohol Use: Yes     Comment: pint of liquor a day.    Review of Systems  Musculoskeletal: Positive for back pain.  Psychiatric/Behavioral: Negative for suicidal ideas, hallucinations and self-injury.  All other systems reviewed and are negative.     Allergies  Lactose intolerance (gi)  Home Medications   Prior to Admission medications   Medication Sig Start Date End Date Taking? Authorizing Provider  abacavir-lamiVUDine (EPZICOM) 600-300 MG per tablet Take 1 tablet by mouth daily. For HIV infection 12/13/11  Yes Encarnacion Slates, NP  ALPRAZolam Duanne Moron) 0.5 MG tablet Take 0.5 mg by mouth daily. 03/26/15  Yes Historical Provider, MD  cloNIDine (CATAPRES) 0.2 MG tablet Take 1 tablet by mouth daily. 03/26/15  Yes Historical Provider, MD  DULoxetine (CYMBALTA) 60 MG capsule Take 1 capsule by mouth daily. 03/26/15  Yes Historical Provider, MD  efavirenz (SUSTIVA) 600 MG tablet Take  1 tablet (600 mg total) by mouth at bedtime. For HIV infection 12/13/11  Yes Encarnacion Slates, NP  furosemide (LASIX) 40 MG tablet Take 1 tablet by mouth daily. 02/10/15  Yes Historical Provider, MD  pantoprazole (PROTONIX) 40 MG tablet Take 1 tablet by mouth daily. 02/10/15  Yes Historical Provider, MD  lisinopril-hydrochlorothiazide (PRINZIDE,ZESTORETIC) 20-25 MG per tablet Take 1 tablet by mouth daily. For high blood pressure control 12/13/11   Encarnacion Slates, NP  mirtazapine (REMERON) 30 MG tablet Take 1 tablet (30 mg total) by mouth at bedtime. For depression/sleep 12/13/11   Encarnacion Slates, NP  traZODone (DESYREL) 50 MG tablet Take 1 tablet (50 mg total) by mouth at bedtime as needed for sleep. For sleep 12/13/11   Encarnacion Slates, NP   BP 160/71 mmHg  Pulse 64  Temp(Src) 98 F (36.7 C) (Oral)  Resp 17  Ht 5\' 8"  (1.727 m)  Wt 154 lb 4.8 oz (69.99 kg)  BMI 23.47 kg/m2  SpO2 99% Physical Exam  Constitutional: He is oriented to person, place, and time. He appears well-developed and well-nourished. No distress.  HENT:  Head: Normocephalic and atraumatic.  Eyes: Pupils are equal, round, and reactive to light.  Neck: Normal range of motion.  Cardiovascular: Normal rate and intact distal pulses.   Pulmonary/Chest: No respiratory distress.  Abdominal: Normal appearance. He exhibits no distension.  Musculoskeletal:  Back:  Neurological: He is alert and oriented to person, place, and time. No cranial nerve deficit.  Skin: Skin is warm and dry. No rash noted.  Psychiatric: He has a normal mood and affect. His speech is normal and behavior is normal. Thought content normal. He expresses no homicidal and no suicidal ideation. He expresses no suicidal plans and no homicidal plans.  Nursing note and vitals reviewed.   ED Course  Procedures (including critical care time) Labs Review Labs Reviewed  COMPREHENSIVE METABOLIC PANEL - Abnormal; Notable for the following:    CO2 18 (*)    Glucose,  Bld 103 (*)    BUN 29 (*)    Creatinine, Ser 2.49 (*)    Calcium 8.5 (*)    Albumin 2.9 (*)    ALT 8 (*)    Alkaline Phosphatase 130 (*)    GFR calc non Af Amer 26 (*)    GFR calc Af Amer 30 (*)    All other components within normal limits  ACETAMINOPHEN LEVEL - Abnormal; Notable for the following:    Acetaminophen (Tylenol), Serum <10 (*)    All other components within normal limits  CBC - Abnormal; Notable for the following:    RBC 3.81 (*)    Hemoglobin 10.3 (*)    HCT 32.4 (*)    All other components within normal limits  URINE RAPID DRUG SCREEN, HOSP PERFORMED - Abnormal; Notable for the following:    Opiates POSITIVE (*)    Benzodiazepines POSITIVE (*)    All other components within normal limits  ETHANOL  SALICYLATE LEVEL  CBG MONITORING, ED    Imaging Review Dg Lumbar Spine Complete  04/28/2015  CLINICAL DATA:  64 year old personal history of L5-S1 discitis osteomyelitis in June, 2016, presenting with acute superimposed upon chronic low back pain. Current heroin use. EXAM: LUMBAR SPINE - COMPLETE 4+ VIEW COMPARISON:  MRI lumbar spine CT lumbar spine 06/24/2014. FINDINGS: 5 non rib-bearing lumbar vertebrae with anatomic posterior alignment. No fractures. Degenerative lumbar bold dextroscoliosis, unchanged. Severe disc space narrowing and endplate hypertrophic changes at L3-4, L4-5 and L5-S1. Moderate disc space narrowing at L2-3. No pars defects. Mild facet degenerative changes at L4-5 and L5-S1. Sacroiliac joints intact. Prior endovascular abdominal aortic aneurysm repair with aortoiliac stent graft. IMPRESSION: 1. No acute osseous abnormality. 2. Severe degenerative disc disease and spondylosis at L3-4, L4-5 and L5-S1. Moderate degenerative disc disease at L2-3. 3. Degenerative lumbar dextroscoliosis. Electronically Signed   By: Evangeline Dakin M.D.   On: 04/28/2015 20:33   I have personally reviewed and evaluated these images and lab results as part of my medical  decision-making.   EKG Interpretation   Date/Time:  Friday April 28 2015 19:16:52 EDT Ventricular Rate:  72 PR Interval:  200 QRS Duration: 86 QT Interval:  469 QTC Calculation: 513 R Axis:   73 Text Interpretation:  Sinus rhythm Left ventricular hypertrophy Minimal ST  elevation, inferior leads Prolonged QT interval Confirmed by Syreeta Figler  MD,  Cambry Spampinato ([id number]) on 04/28/2015 7:46:44 PM     Patient seen and evaluated by TTS.  Patient did not meet admission criteria.  Will be discharged with resources for outpatient follow-up and detox. MDM   Final diagnoses:  Uncomplicated opioid dependence ([place])  Renal insufficiency        Leonard Schwartz, MD 04/28/15 2253

## 2015-04-28 NOTE — ED Notes (Signed)
TTS machine placed in room.

## 2015-04-28 NOTE — ED Notes (Signed)
Patient verbalized understanding of discharge instructions and denies any further needs or questions at this time. Went over outpatient detox/rehab facilities list with patient, who states he will call some of them to see where he can get in at. VS stable. Patient ambulatory with steady gait. Assisted to ED entrance in wheelchair.

## 2015-04-28 NOTE — ED Notes (Signed)
Patient talking with [facility] now.

## 2015-04-28 NOTE — ED Notes (Signed)
The pt wanst to detox from heroin his last use was this am. Iv heroin.  He also wants to be seen for back pain he fell 3 weeks ago and he has had back pain since then

## 2015-04-28 NOTE — ED Notes (Signed)
Pt to ED for multiple complaints - original complaint, per pt is detox from heroin. As MD enters room, pt begins complaining about chronic back pain, stating he fell this morning, as well hernia pain. Patient states last heroin use was this morning. Denies CP, SOB. A&O x 4.

## 2015-04-28 NOTE — Discharge Instructions (Signed)
Chronic Kidney Disease Chronic kidney disease happens when the kidneys are damaged over a long period. The kidneys are two organs that do many important jobs in the body. These jobs include:  Removing wastes and extra fluids from the blood.  Making hormones that help to keep the body healthy.  Making sure that the body has the right amount of fluids and chemicals. Chronic kidney disease may be caused by many things. The kidney damage occurs slowly. If too much damage occurs, the kidneys may stop working the way that they should. This is dangerous. Treatment can help to slow down the damage and keep it from getting worse. HOME CARE  Follow your diet as told by your doctor. You may need to limit the amount of salt (sodium) and protein that you eat each day.  Take medicines only as told by your doctor. Do not take any new medicines unless your doctor approves it.  Quit smoking if you smoke. Talk to your doctor about programs that may help you quit smoking.  Have your blood pressure checked regularly and keep track of the results.  Start or keep doing an exercise plan.  Get shots (immunizations) as told by your doctor.  Take vitamins and minerals as told by your doctor.  Keep all follow-up visits as told by your doctor. This is important. GET HELP RIGHT AWAY IF:   Your symptoms get worse.  You have new symptoms.  You have symptoms of end-stage kidney disease. These include:  Headaches.  Skin that is darker or lighter than normal.  Numbness in the hands or feet.  Easy bruising.  Frequent hiccups.  Stopping of menstrual periods in women.  You have a fever.  You are making very little pee (urine).  You have pain or bleeding when you pee.   This information is not intended to replace advice given to you by your health care provider. Make sure you discuss any questions you have with your health care provider.   Document Released: 04/03/2009 Document Revised: 09/28/2014  Document Reviewed: 09/06/2011 Elsevier Interactive Patient Education 2016 [facility]stance Abuse Adult The United Ways 211 is a great source of information about community services available.  Access by dialing 2-1-1 from anywhere in [place], or by website -  CustodianSupply.fi.   Other Local Resources (Updated 01/2015)  [facility]  Crisis Hotline, available 24 hours a day, 7 days a week: [place], [place]   [facility]  Crisis Hotline, available 24 hours a day, 7 days a week: [place], [place]  [facility]  Suicide Prevention Hotline, available 24 hours a day, 7 days a week: [place], [place], available 24 hours a day, 7 days a week: [place], [place] Access to [employer], available 24 hours a day, 7 days a week: 725-372-7597 All   [facility]  Crisis Hotline, available 24 hours a day, 7 days a week: 4784860388 All   Other Local Resources (Updated 01/2015)  Outpatient Counseling/ Substance Abuse Programs  Services     Address and Phone Number  ADS (Alcohol and Drug Services)   Options include Individual counseling, group counseling, intensive outpatient program (several hours a day, several days a week)  Offers depression assessments  Provides methadone maintenance program [phone] [address] [employer]  [place], [place] [address]   [facility]   Offers partial hospitalization/day treatment and DUI/DWI programs  Accepts Medicare, private insurance [phone] [phone] [place], [place] [address]  [place] include intensive outpatient program (several hours a day, several days a week), outpatient treatment, DUI/DWI services,  family education  Also has some services specifically for [employer] transitional housing  [phone] [address] [place], [place] [address]     [place] Medicare, private pay, and private insurance [phone] [address] [place], [place] [address]  [facility]  Services include individual counseling, substance abuse intensive outpatient program (several hours a day, several days a week), day treatment  Blinda [place]ather[place], Medicaid, private insurance [phone] [address], [place], [place] [address]  [facility]   Offers substance abuse intensive outpatient program (several hours a day, several days a week), partial hospitalization program [phone] [address] [place], [place] [address]  [phone] [address] [place], [place] [address]  [phone] [place], [place] [address]  [phone], [place], [place] [address]  [facility]  Individual counseling only  Accepts private insurance only [phone] [address] [place], [place] [address]  [facility]: Methadone Clinic  Methadone maintenance program [phone] [place], [place] [address]  [facility] providing substance abuse and mental health counseling  Accepts Medicaid, Medicare, private insurance  Offers sliding scale for uninsured [phone] [place], [place] in [place] individual counseling, and intensive in-home services [phone] [address] [place], [place] [address]  [facility] [place] individual counseling, family counseling, group therapy, domestic violence counseling, consumer credit counseling  Accepts Medicare, Medicaid, private insurance  Offers sliding scale for  uninsured [phone] [address] [place], [place] [address]  [phone] [facility], [address] [place], [place]  [facility]  Offers individual, family and group counseling  3 locations - [place], [place], and [place]  [place] [address] [place], [place] [address]  [address] [place], [place] [address]  [place], [place] [address]  [facility] [place] [facility]    Offers psychiatric assessment, 8-week Intensive Outpatient Program (several hours a day, several times a week, daytime or evenings), early recovery group, family Program, medication management  Private pay or private insurance only [phone], or  [phone] [address] [place], [place] [address]  [facility]  Offers individual, couples and family counseling  Accepts Medicaid, private insurance, and sliding scale for uninsured [phone] [address] [place], [place] [address]  Launa Flight, MD  Individual counseling  Private insurance [phone] [place], [place] [address]  [facility]   Offers assessment, substance abuse treatment, and behavioral health treatment [phone] [address] [place], [place] [address]  [facility]  Individual counseling  Accepts private insurance [phone] [place], [place] [address]  [facility]  Individual counseling  Blinda Leatherwood, private insurance [phone] [place],  [address]  [facility]    Offers intensive outpatient program (several hours a day, several times a week)  Private pay, private insurance [place], [place]  Individual counseling  Medicare, private insurance  [phone] [address], [place], [place] [address]  [place]     Offers intensive outpatient program (several hours a day, several times a week) and partial hospitalization program [phone] [place], [place] [address]  Letta Moynahan, MD  Individual counseling [phone] [address] [place], [place] [address]  [facility]  Offers Christian counseling to individuals, couples, and families  Accepts Medicare and private insurance; offers sliding scale for uninsured [phone] [place], [place] [address]  [facility]  Christian counseling [phone] [address] [place], [place] [address]  [facility] [employer] crisis counseling, individual counseling, group therapy, in-home therapy, domestic violence services, day treatment, DWI services, Conservation officer, nature (CST), Actuary (ACTT), substance abuse Intensive Outpatient Program (several hours a day, several times a week)  2 locations - [place] and [place], [place] [address]  [phone] [address] [place], [place] [address]  [place] counseling and group therapy  [employer], [place], [place] [phone] [address] [place], [place]  [facility]  Offers individual and family counseling  Offers LGBTQ services  Accepts private insurance and private pay [phone] [place], [place] [address]  [facility]    Offers individual counseling, group therapy, and outpatient detox  Accepts private insurance [phone] [place], [place] Medicare, private insurance [phone] [address], [place], [place] [address]  [employer]  Individual counseling  Accepts Medicare, private insurance  [phone] [address] [place], [place] [address]  [facility]per[facility] Sheets [place] substance abuse Intensive Outpatient Program (several hours a day, several times a week) [phone], or [phone] [place], [place] [facility]idential  Substance Abuse Treatment Adults The United Ways 211 is a great source of information about community services available.  Access by dialing 2-1-1 from anywhere in [place], or by website -  CustodianSupply.fi.   (Updated 01/2015)  Crisis Assistance 24 hours a day   [place]  24-hour crisis assistance: [place], [place]   [facility]  24-hour crisis [place], [place]   24-hour crisis assistance: [place], [place] Access to Care Line  24-hour crisis assistance; [phone] All   [facility]  24-hour crisis response line: [phone] All   Other Local Resources (Updated 01/2015)  Inpatient Behavioral Health/Residential Substance Abuse Treatment Programs   Services      Address and Phone Number  [place] ([place])   14-day residential rehabilitation  [phone] [address] [place], [place] ([place])    Detox - private pay only  14-day residential rehabilitation -  Medicaid, insurance, private pay only [phone], or [place], [place], [place] [address]   [place] only  Multiple facilities [phone] admissions   [facility] ([place])   90-day program  Must be homeless to participate  [phone], or [place], [place] only [phone], or  [place], [place] [address]  [place]     Must make an appointment  Transportation is offered from [employer] on [employer] [address].  Accepts private pay, [place]vin So [facility] [phone]  [place] [address] [place], [place] [address]   [employer]  Females only  Associated with the [place] [phone] [place], [place] [address]  [facility]   Private insurance only [phone], or [phone] [place], [id number]  [place]    Detox  Residential rehabilitation  Private insurance only  Multiple locations [phone] admissions  [place] of [place] pay  Private insurance [phone] [address] [place] [address]  [facility]    Males only  Fee required at time of admission [place], [place] [address]  [facility] [place] pay only  [phone] [address] [place], [place]  RTS (Residential Treatment Services)    Detox - private pay, Medicaid  Residential rehabilitation for males  - Medicare, Medicaid, insurance, private pay [phone] [place], [place] interviews Monday - Saturday from 8 am - 4 pm  Individuals with legal charges are not eligible [phone] [address] [place],  [address]  [facility]   Must be willing to work  Must attend Alcoholics Anonymous meetings [phone] [address] [place], [place]    Faith-based program  Private pay only [phone] [place], [place]

## 2015-04-28 NOTE — BH Assessment (Addendum)
Tele Assessment Note   Russell [name] is an 65 y.o. male who presents unaccompanied to [facility] ED after falling in the shower today resulting in back pain. Pt states that he has a history of abusing opiates and relapsed on heroin in October 2016. He reports he is using approximately one gram of heroin intravenously on a daily basis when he cannot obtain narcotic pain medications. Pt says he smokes marijuana infrequently and last used one month ago. He states he is prescribed Xanax and denies he abuses that medication, stating he takes 1-2 tabs daily. Pt denies abusing alcohol or any other substances. Pt describes his mood as "pretty good." He denies depressive symptoms. He denies current suicidal ideation or history of suicide attempts. He denies any history of intentional self-injurious behavior. Protective factors against suicide include good family support, future orientation, therapeutic relationship with PCP, Dr. Broadus John Copper, no access to firearms, religious convictions and no prior attempts. Pt denies homicidal ideation or history of violence. Pt denies any auditory or visual hallucinations.  Pt reports he is widowed and lives alone. He says he has two children who visit him daily and are good support. He cannot identify any stressors other than his substance abuse and physical ailments. Pt reports he has gone over ten years without using in the past. He currently has no outpatient mental health or substance abuse providers.   Pt is dressed in hospital scrubs, alert, oriented x4 with normal speech and normal motor behavior. Eye contact is good. Pt's mood is euthymic and affect is congruent with mood. Thought process is coherent and relevant. There is no indication Pt is currently responding to internal stimuli or experiencing delusional thought content. Pt is requesting treatment for heroin use.   Diagnosis: Opioid Use Disorder, Severe  Past Medical History:  Past Medical History  Diagnosis  Date  . Abdominal aortic aneurysm ([place])   . Hypertension   . Renal cell cancer ([place])   . Hepatitis C carrier   . Chronic renal insufficiency, stage II (mild)   . HIV (human immunodeficiency virus infection) ([place])   . Chronic hepatitis C [facility])     Past Surgical History  Procedure Laterality Date  . Hernia repair    . Abdominal aortic aneurysm repair      Family History: No family history on file.  Social History:  reports that he has been smoking.  He does not have any smokeless tobacco history on file. He reports that he drinks alcohol. He reports that he uses illicit drugs (IV and Heroin).  Additional Social History:  Alcohol / Drug Use Pain Medications: See MAR Prescriptions: See MAR Over the Counter: See MAR History of alcohol / drug use?: Yes (Pt has a history of abusing Oxycodone) Longest period of sobriety (when/how long): 10 years Negative Consequences of Use: Personal relationships Withdrawal Symptoms: Nausea / Vomiting, Diarrhea, Cramps Substance #1 Name of Substance 1: Heroin (I.V.) 1 - Age of First Use: 40's 1 - Amount (size/oz): Approximately one gram 1 - Frequency: Daily  1 - Duration: relapsed October 2016 1 - Last Use / Amount: 04/28/15, one gram  CIWA: CIWA-Ar BP: 177/82 mmHg Pulse Rate: 78 COWS:    PATIENT STRENGTHS: (choose at least two) Ability for insight Average or above average intelligence Capable of independent living Communication skills Financial means General fund of knowledge Motivation for treatment/growth Supportive family/friends  Allergies:  Allergies  Allergen Reactions  . Lactose Intolerance (Gi)     Gas, diarrhea  Home Medications:  (Not in a hospital admission)  OB/GYN Status:  No LMP for male patient.  General Assessment Data Location of Assessment: [facility] ED TTS Assessment: In system Is this a Tele or Face-to-Face Assessment?: Tele Assessment Is this an Initial Assessment or a Re-assessment for this encounter?:  Initial Assessment Marital status: Widowed [place] name: NA Is patient pregnant?: No Pregnancy Status: No Living Arrangements: Alone Can pt return to current living arrangement?: Yes Admission Status: Voluntary Is patient capable of signing voluntary admission?: Yes Referral Source: Self/Family/Friend Insurance type: Medicaid     Crisis Care Plan Living Arrangements: Alone Legal Guardian: Other: (None) Name of Psychiatrist: None Name of Therapist: None  Education Status Is patient currently in school?: No Current Grade: NA Highest grade of school patient has completed:  Some college Name of school: NA Contact person: NA  Risk to self with the past 6 months Suicidal Ideation: No Has patient been a risk to self within the past 6 months prior to admission? : No Suicidal Intent: No Has patient had any suicidal intent within the past 6 months prior to admission? : No Is patient at risk for suicide?: No Suicidal Plan?: No Has patient had any suicidal plan within the past 6 months prior to admission? : No Access to Means: No What has been your use of drugs/alcohol within the last 12 months?: Pt is using heroin Previous Attempts/Gestures: No How many times?: 0 Other Self Harm Risks: None Triggers for Past Attempts: None known Intentional Self Injurious Behavior: None Family Suicide History: No Recent stressful life event(s): Other (Comment) (Back pain) Persecutory voices/beliefs?: No Depression: No Depression Symptoms:  (Pt denies depressive symptoms) Substance abuse history and/or treatment for substance abuse?: Yes Suicide prevention information given to non-admitted patients: Yes  Risk to Others within the past 6 months Homicidal Ideation: No Does patient have any lifetime risk of violence toward others beyond the six months prior to admission? : No Thoughts of Harm to Others: No Current Homicidal Intent: No Current Homicidal Plan: No Access to Homicidal Means:  No Identified Victim: None History of harm to others?: No Assessment of Violence: None Noted Violent Behavior Description: Pt denies history of violence Does patient have access to weapons?: No Criminal Charges Pending?: No Does patient have a court date: No Is patient on probation?: No  Psychosis Hallucinations: None noted Delusions: None noted  Mental Status Report Appearance/Hygiene: In scrubs Eye Contact: Good Motor Activity: Unremarkable Speech: Logical/coherent Level of Consciousness: Alert Mood: Euthymic Affect: Appropriate to circumstance Anxiety Level: None Thought Processes: Coherent, Relevant Judgement: Unimpaired Orientation: Person, Place, Time, Situation, Appropriate for developmental age Obsessive Compulsive Thoughts/Behaviors: None  Cognitive Functioning Concentration: Normal Memory: Recent Intact, Remote Intact IQ: Average Insight: Good Impulse Control: Good Appetite: Fair Weight Loss: 0 Weight Gain: 0 Sleep: No Change Total Hours of Sleep: 8 Vegetative Symptoms: None  ADLScreening [facility] Assessment Services) Patient's cognitive ability adequate to safely complete daily activities?: Yes Patient able to express need for assistance with ADLs?: Yes Independently performs ADLs?: Yes (appropriate for developmental age)  Prior Inpatient Therapy Prior Inpatient Therapy: Yes Prior Therapy Dates: November 2013 Prior Therapy Facilty/Provider([facility]): [facility] Reason for Treatment: Substance abuse  Prior Outpatient Therapy Prior Outpatient Therapy: Yes Prior Therapy Dates: Unknown Prior Therapy Facilty/Provider(s): [facility] Reason for Treatment: Substance abuse Does patient have an ACCT team?: No Does patient have Intensive In-House Services?  : No Does patient have Monarch services? : No Does patient have P4CC services?: No  ADL Screening (condition  at time of admission) Patient's cognitive ability adequate to safely complete daily activities?: Yes Is the  patient deaf or have difficulty hearing?: No Does the patient have difficulty seeing, even when wearing glasses/contacts?: No Does the patient have difficulty concentrating, remembering, or making decisions?: No Patient able to express need for assistance with ADLs?: Yes Does the patient have difficulty dressing or bathing?: No Independently performs ADLs?: Yes (appropriate for developmental age) Does the patient have difficulty walking or climbing stairs?: No Weakness of Legs: None Weakness of Arms/Hands: None       Abuse/Neglect Assessment (Assessment to be complete while patient is alone) Physical Abuse: Denies Verbal Abuse: Denies Sexual Abuse: Denies Exploitation of patient/patient's resources: Denies Self-Neglect: Denies     Regulatory affairs officer (For Healthcare) Does patient have an advance directive?: No Would patient like information on creating an advanced directive?: No - patient declined information    Additional Information 1:1 In Past 12 Months?: No CIRT Risk: No Elopement Risk: No Does patient have medical clearance?: Yes     Disposition:  Consulted with Dr. Carlton Adam who said Pt does not meet criteria for inpatient treatment and recommends Pt be given referrals to outpatient substance abuse services. Notified Dr. Leonard Schwartz and Myriam Forehand, RN of recommendation.  Disposition Initial Assessment Completed for this Encounter: Yes Disposition of Patient: Outpatient treatment Type of outpatient treatment: Adult   Evelena Peat, [facility], [facility], [facility] Triage Specialist [phone]   Evelena Peat 04/28/2015 10:26 PM

## 2015-08-25 ENCOUNTER — Telehealth: Payer: Self-pay | Admitting: *Deleted

## 2015-08-25 NOTE — Telephone Encounter (Signed)
Nurse for Dr Olivia Mackie at [facility] called and advised the patient was in the office today and refused labs because he is having some done here 09/05/15. She wanted to know if the labs would include a CMP/CBC. Advised her they would and she asked if we could forward the results to them at [phone] as the patient is refusing to do labs today. Advised her we could once they result provided he shows for the lab visit.

## 2015-09-05 ENCOUNTER — Other Ambulatory Visit: Payer: Self-pay

## 2015-09-08 NOTE — Telephone Encounter (Signed)
Preparing to fax the patient labs and he did not show up for his lab visit.

## 2015-09-19 ENCOUNTER — Ambulatory Visit: Payer: Self-pay | Admitting: Internal Medicine

## 2016-05-11 ENCOUNTER — Inpatient Hospital Stay
Admission: RE | Admit: 2016-05-11 | Discharge: 2016-06-05 | Disposition: A | Discharge status: Skilled Nursing Facility | Payer: Medicare Other | Source: Other Acute Inpatient Hospital | Attending: Internal Medicine | Admitting: Internal Medicine

## 2016-05-11 DIAGNOSIS — Z95828 Presence of other vascular implants and grafts: Secondary | ICD-10-CM

## 2016-05-11 DIAGNOSIS — J189 Pneumonia, unspecified organism: Secondary | ICD-10-CM

## 2016-05-11 DIAGNOSIS — Z992 Dependence on renal dialysis: Secondary | ICD-10-CM

## 2016-05-11 DIAGNOSIS — Z419 Encounter for procedure for purposes other than remedying health state, unspecified: Secondary | ICD-10-CM

## 2016-05-11 DIAGNOSIS — I770 Arteriovenous fistula, acquired: Secondary | ICD-10-CM

## 2016-05-11 DIAGNOSIS — Z452 Encounter for adjustment and management of vascular access device: Secondary | ICD-10-CM

## 2016-05-11 LAB — TSH: TSH: 0.35 u[IU]/mL (ref 0.350–4.500)

## 2016-05-12 ENCOUNTER — Other Ambulatory Visit (HOSPITAL_COMMUNITY): Payer: Medicare Other

## 2016-05-12 LAB — CBC WITH DIFFERENTIAL/PLATELET
Basophils Absolute: 0 10*3/uL (ref 0.0–0.1)
Basophils Relative: 0 %
EOS ABS: 0.4 10*3/uL (ref 0.0–0.7)
EOS PCT: 3 %
HCT: 22 % — ABNORMAL LOW (ref 39.0–52.0)
Hemoglobin: 7.3 g/dL — ABNORMAL LOW (ref 13.0–17.0)
LYMPHS ABS: 1 10*3/uL (ref 0.7–4.0)
Lymphocytes Relative: 7 %
MCH: 27.2 pg (ref 26.0–34.0)
MCHC: 33.2 g/dL (ref 30.0–36.0)
MCV: 82.1 fL (ref 78.0–100.0)
Monocytes Absolute: 0.6 10*3/uL (ref 0.1–1.0)
Monocytes Relative: 5 %
Neutro Abs: 11.5 10*3/uL — ABNORMAL HIGH (ref 1.7–7.7)
Neutrophils Relative %: 85 %
Platelets: 403 10*3/uL — ABNORMAL HIGH (ref 150–400)
RBC: 2.68 MIL/uL — ABNORMAL LOW (ref 4.22–5.81)
RDW: 15.9 % — ABNORMAL HIGH (ref 11.5–15.5)
WBC: 13.5 10*3/uL — AB (ref 4.0–10.5)

## 2016-05-12 LAB — BASIC METABOLIC PANEL
ANION GAP: 12 (ref 5–15)
BUN: 60 mg/dL — ABNORMAL HIGH (ref 6–20)
CALCIUM: 7 mg/dL — AB (ref 8.9–10.3)
CHLORIDE: 110 mmol/L (ref 101–111)
CO2: 16 mmol/L — AB (ref 22–32)
CREATININE: 4.62 mg/dL — AB (ref 0.61–1.24)
GFR calc non Af Amer: 12 mL/min — ABNORMAL LOW (ref >60)
GFR, EST AFRICAN AMERICAN: 14 mL/min — AB (ref >60)
Glucose, Bld: 84 mg/dL (ref 65–99)
Potassium: 3.3 mmol/L — ABNORMAL LOW (ref 3.5–5.1)
Sodium: 138 mmol/L (ref 135–145)

## 2016-05-12 LAB — PROTIME-INR
INR: 1.35
PROTHROMBIN TIME: 16.8 s — AB (ref 11.4–15.2)

## 2016-05-12 LAB — PHOSPHORUS: Phosphorus: 6.4 mg/dL — ABNORMAL HIGH (ref 2.5–4.6)

## 2016-05-12 LAB — MAGNESIUM: Magnesium: 1.6 mg/dL — ABNORMAL LOW (ref 1.7–2.4)

## 2016-05-13 ENCOUNTER — Other Ambulatory Visit (HOSPITAL_COMMUNITY): Payer: Medicare Other

## 2016-05-13 LAB — CBC WITH DIFFERENTIAL/PLATELET
BASOS PCT: 0 %
Basophils Absolute: 0 10*3/uL (ref 0.0–0.1)
EOS ABS: 0.3 10*3/uL (ref 0.0–0.7)
Eosinophils Relative: 3 %
HCT: 21.4 % — ABNORMAL LOW (ref 39.0–52.0)
HEMOGLOBIN: 6.9 g/dL — AB (ref 13.0–17.0)
Lymphocytes Relative: 7 %
Lymphs Abs: 0.9 10*3/uL (ref 0.7–4.0)
MCH: 27.3 pg (ref 26.0–34.0)
MCHC: 32.2 g/dL (ref 30.0–36.0)
MCV: 84.6 fL (ref 78.0–100.0)
MONO ABS: 0.9 10*3/uL (ref 0.1–1.0)
MONOS PCT: 7 %
NEUTROS PCT: 84 %
Neutro Abs: 10.7 10*3/uL — ABNORMAL HIGH (ref 1.7–7.7)
PLATELETS: 386 10*3/uL (ref 150–400)
RBC: 2.53 MIL/uL — ABNORMAL LOW (ref 4.22–5.81)
RDW: 16.4 % — AB (ref 11.5–15.5)
WBC: 12.7 10*3/uL — ABNORMAL HIGH (ref 4.0–10.5)

## 2016-05-13 LAB — RENAL FUNCTION PANEL
ALBUMIN: 1.5 g/dL — AB (ref 3.5–5.0)
Anion gap: 11 (ref 5–15)
BUN: 57 mg/dL — AB (ref 6–20)
CO2: 16 mmol/L — ABNORMAL LOW (ref 22–32)
CREATININE: 4.7 mg/dL — AB (ref 0.61–1.24)
Calcium: 7 mg/dL — ABNORMAL LOW (ref 8.9–10.3)
Chloride: 114 mmol/L — ABNORMAL HIGH (ref 101–111)
GFR calc Af Amer: 14 mL/min — ABNORMAL LOW (ref >60)
GFR calc non Af Amer: 12 mL/min — ABNORMAL LOW (ref >60)
GLUCOSE: 98 mg/dL (ref 65–99)
PHOSPHORUS: 5.3 mg/dL — AB (ref 2.5–4.6)
Potassium: 3.7 mmol/L (ref 3.5–5.1)
Sodium: 141 mmol/L (ref 135–145)

## 2016-05-13 LAB — MAGNESIUM: Magnesium: 1.6 mg/dL — ABNORMAL LOW (ref 1.7–2.4)

## 2016-05-13 LAB — HEMOGLOBIN A1C
HEMOGLOBIN A1C: 5.6 % (ref 4.8–5.6)
Mean Plasma Glucose: 114 mg/dL

## 2016-05-13 LAB — PREPARE RBC (CROSSMATCH)

## 2016-05-13 LAB — ABO/RH: ABO/RH(D): B POS

## 2016-05-13 NOTE — Consult Note (Signed)
Date: 05/13/2016                  Patient Name:  Russell [name]  MRN: [id number]  DOB: 05/16/1950  Age / Sex: 66 y.o., male         PCP: No primary care provider on file.                 Service Requesting Consult: [facility]                 Reason for Consult: ARF            History of Present Illness: Patient is a 66 y.o. male with medical problems of HIV > 20 yrs, Coronary artery disease, chronic back pain, stage IV CKD, history of substance abuse, hepatitis B and  C, bilateral complex renal cysts with left renal mass , abdominal aortic aneurysm repair, hernia repair, who was admitted to [facility] on 05/11/2016 for prolonged antibiotic therapy for endocarditis. Patient was originally admitted to [facility] for severe sepsis. Patient was noted to have septic emboli, stroke and MSSA bacteremia. He is being treated with IV nafcillin for 6 weeks for endocarditis.  He is being followed by nephrology at [facility] by Marylin Crosby, MD Review of care everywhere records show that his creatinine peaked at 7.84 on April 11. Since then it has been improving and appears to have stabilized at current levels of 4.2/GFR 17  Medications: Outpatient medications: Prescriptions Prior to Admission  Medication Sig Dispense Refill Last Dose  . abacavir-lamiVUDine (EPZICOM) 600-300 MG per tablet Take 1 tablet by mouth daily. For HIV infection   Past Week at Unknown time  . ALPRAZolam (XANAX) 0.5 MG tablet Take 0.5 mg by mouth daily.  1 04/27/2015 at Unknown time  . cloNIDine (CATAPRES) 0.2 MG tablet Take 1 tablet by mouth daily.  1 Past Week at Unknown time  . DULoxetine (CYMBALTA) 60 MG capsule Take 1 capsule by mouth daily.  1 04/27/2015 at Unknown time  . efavirenz (SUSTIVA) 600 MG tablet Take 1 tablet (600 mg total) by mouth at bedtime. For HIV infection   Past Week at Unknown time  . furosemide (LASIX) 40 MG tablet Take 1 tablet by mouth daily.  1 04/27/2015  at Unknown time  . lisinopril-hydrochlorothiazide (PRINZIDE,ZESTORETIC) 20-25 MG per tablet Take 1 tablet by mouth daily. For high blood pressure control     . mirtazapine (REMERON) 30 MG tablet Take 1 tablet (30 mg total) by mouth at bedtime. For depression/sleep 30 tablet 0   . pantoprazole (PROTONIX) 40 MG tablet Take 1 tablet by mouth daily.  5 04/27/2015 at Unknown time  . traZODone (DESYREL) 50 MG tablet Take 1 tablet (50 mg total) by mouth at bedtime as needed for sleep. For sleep 60 tablet 0     Current medications: No current facility-administered medications for this encounter.    Amlodipine 10 mg daily Aspirin 325 mg daily Dapsone 100 mg daily Epivir  Abacavir Hydralazine 50 mg 3 times a day Isosorbide 30 mg daily Labetalol 200 mg every 12 hours D [place] Magnesium oxide 800 mg twice a day Nafcillin 2 g IV every 4 hours Nephro-Vite1 tablet daily Potassium chloride 20 mEq Sustiva 600 mg daily   Allergies: Allergies  Allergen Reactions  . Lactose Intolerance (Gi)     Gas, diarrhea       Past Medical History: Past Medical History:  Diagnosis Date  . Abdominal aortic aneurysm (  [place])   . Chronic hepatitis C ([place])   . Chronic renal insufficiency, stage II (mild)   . Hepatitis C carrier ([place])   . HIV (human immunodeficiency virus infection) ([place])   . Hypertension   . Renal cell cancer [facility])      Past Surgical History: Past Surgical History:  Procedure Laterality Date  . ABDOMINAL AORTIC ANEURYSM REPAIR    . HERNIA REPAIR       Family History: No family history on file.   Social History: Social History   Social History  . Marital status: Widowed    Spouse name: N/A  . Number of children: N/A  . Years of education: N/A   Occupational History  . Not on file.   Social History Main Topics  . Smoking status: Current Every Day Smoker    Packs/day: 1.50    Years: 2.00  . Smokeless tobacco: Not on file  . Alcohol use Yes     Comment: pint of liquor a  day.  . Drug use: Yes    Types: IV, Heroin     Comment: "shot some dope last night and this am. "heroin.""  . Sexual activity: No   Other Topics Concern  . Not on file   Social History Narrative  . No narrative on file     Review of Systems: limited participation by the patient Gen: no fevers or chills HEENT:  Denies any vision or hearingproblems CV: some shortness of breath with exertion Resp: no cough or sputum production OZ:HYQMVHQI is poor, abdominal distention GU : denies any problems MS: denies any complaints Derm:  denies complaints Psych:denies complaints Heme: denies complaints Neuro: denies complaints Endocrine. Denies complaints  Vital Signs: Temperature 98.1, pulse 105, respirations 22, blood pressure 174/95  Weight trends: There were no vitals filed for this visit.  Physical Exam: General:  chronically ill-appearing, laying in the bed  HEENT Pale conjunctiva, moist mucous membranes  Neck:  supple  Lungs: Mild bilateral crackles, coarse breath sounds  Heart::  irregular tachycardic, soft systolic murmur  Abdomen: Significantly distended from ascites  Extremities:  2-3+ pitting edema  Neurologic: Alert, oriented   Skin: Warm, dry             Lab results: Basic Metabolic Panel:  Recent Labs Lab 05/12/16 0821  NA 138  K 3.3*  CL 110  CO2 16*  GLUCOSE 84  BUN 60*  CREATININE 4.62*  CALCIUM 7.0*  MG 1.6*  PHOS 6.4*    Liver Function Tests: No results for input(s): AST, ALT, ALKPHOS, BILITOT, PROT, ALBUMIN in the last 168 hours. No results for input(s): LIPASE, AMYLASE in the last 168 hours. No results for input(s): AMMONIA in the last 168 hours.  CBC:  Recent Labs Lab 05/12/16 0821  WBC 13.5*  NEUTROABS 11.5*  HGB 7.3*  HCT 22.0*  MCV 82.1  PLT 403*    Cardiac Enzymes: No results for input(s): CKTOTAL, TROPONINI in the last 168 hours.  BNP: Invalid input(s): POCBNP  CBG: No results for input(s): GLUCAP in the last 168  hours.  Microbiology: No results found for this or any previous visit (from the past 720 hour(s)).   Coagulation Studies:  Recent Labs  05/12/16 0821  LABPROT 16.8*  INR 1.35    Urinalysis: No results for input(s): COLORURINE, LABSPEC, PHURINE, GLUCOSEU, HGBUR, BILIRUBINUR, KETONESUR, PROTEINUR, UROBILINOGEN, NITRITE, LEUKOCYTESUR in the last 72 hours.  Invalid input(s): APPERANCEUR      Imaging: Dg Chest Port 1 View  Result  Date: 05/12/2016 CLINICAL DATA:  Patient with history of pneumonia. EXAM: PORTABLE CHEST 1 VIEW COMPARISON:  Chest radiograph 05/01/2016 FINDINGS: Monitoring leads overlie the patient. Stable cardiac and mediastinal contours. Low lung volumes. Bilateral mid and lower lung heterogeneous pulmonary opacities. No pleural effusion or pneumothorax. IMPRESSION: Mid and lower lung heterogeneous opacities may represent atelectasis or infection. Followup PA and lateral chest X-ray is recommended in 3-4 weeks following trial of antibiotic therapy to ensure resolution and exclude underlying malignancy. Electronically Signed   By: Lovey Newcomer M.D.   On: 05/12/2016 14:01      Assessment & Plan: Pt is a 66 y.o. African [place] male admitted on 05/11/2016 with Endocarditis.  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 Acute renal failure. Creatinine peaked at 7.84, stabilized at current level of 4.2/GFR 17  2. Chronic kidney disease stage IV with Anasarca Baseline creatinine 4.2 suggestive of 17 However, with poor muscle mass, his renal function is lowe than estimated GFR. Patient does have significant volume overload with ascites and lower extremity edema.    We will try to manage his fluid with IV Lasix infusion however given poor renal function, patient was offered to be started on hemodialysis. He states that he knows some people on dialysis and knows about it. He does not want to go on dialysis. When specifically asked whether he would take dialysis if it is necessary to save his life- patient stated NO He will discuss this further with his family   3. Anemia Patient is getting ready to get blood transfusion today  4. Acidosis Likely secondary to renal failure  5. Bilateral renal cysts an exophytic left renal mass Evaluated by CT of abdomen without contrast on April 12 Mixed simple, complex proteinaceous/hemorrhagic renal cysts    exophytic focus In the interpolar region of left kidney. Ablation site. Other differential includes angiomyolipoma.  2-D echo from April of 2018 from [facility] shows normal LVEF at 55-60%. Normal right ventricular size and function noted. No pericardial effusion.

## 2016-05-14 LAB — RENAL FUNCTION PANEL
ALBUMIN: 1.4 g/dL — AB (ref 3.5–5.0)
Anion gap: 12 (ref 5–15)
BUN: 55 mg/dL — AB (ref 6–20)
CALCIUM: 7 mg/dL — AB (ref 8.9–10.3)
CO2: 17 mmol/L — AB (ref 22–32)
Chloride: 112 mmol/L — ABNORMAL HIGH (ref 101–111)
Creatinine, Ser: 4.46 mg/dL — ABNORMAL HIGH (ref 0.61–1.24)
GFR calc Af Amer: 15 mL/min — ABNORMAL LOW (ref >60)
GFR calc non Af Amer: 13 mL/min — ABNORMAL LOW (ref >60)
GLUCOSE: 83 mg/dL (ref 65–99)
PHOSPHORUS: 6.1 mg/dL — AB (ref 2.5–4.6)
Potassium: 3.6 mmol/L (ref 3.5–5.1)
SODIUM: 141 mmol/L (ref 135–145)

## 2016-05-14 LAB — TYPE AND SCREEN
ABO/RH(D): B POS
Antibody Screen: NEGATIVE
UNIT DIVISION: 0

## 2016-05-14 LAB — CBC
HCT: 23.6 % — ABNORMAL LOW (ref 39.0–52.0)
Hemoglobin: 7.8 g/dL — ABNORMAL LOW (ref 13.0–17.0)
MCH: 27.4 pg (ref 26.0–34.0)
MCHC: 33.1 g/dL (ref 30.0–36.0)
MCV: 82.8 fL (ref 78.0–100.0)
PLATELETS: 394 10*3/uL (ref 150–400)
RBC: 2.85 MIL/uL — ABNORMAL LOW (ref 4.22–5.81)
RDW: 16.2 % — AB (ref 11.5–15.5)
WBC: 12 10*3/uL — ABNORMAL HIGH (ref 4.0–10.5)

## 2016-05-14 LAB — BPAM RBC
BLOOD PRODUCT EXPIRATION DATE: 201804302359
ISSUE DATE / TIME: 201804231426
Unit Type and Rh: 1700

## 2016-05-14 LAB — MAGNESIUM: Magnesium: 1.5 mg/dL — ABNORMAL LOW (ref 1.7–2.4)

## 2016-05-15 LAB — RENAL FUNCTION PANEL
ANION GAP: 13 (ref 5–15)
Albumin: 1.5 g/dL — ABNORMAL LOW (ref 3.5–5.0)
BUN: 53 mg/dL — AB (ref 6–20)
CO2: 17 mmol/L — AB (ref 22–32)
Calcium: 7.2 mg/dL — ABNORMAL LOW (ref 8.9–10.3)
Chloride: 111 mmol/L (ref 101–111)
Creatinine, Ser: 4.68 mg/dL — ABNORMAL HIGH (ref 0.61–1.24)
GFR calc Af Amer: 14 mL/min — ABNORMAL LOW (ref >60)
GFR calc non Af Amer: 12 mL/min — ABNORMAL LOW (ref >60)
GLUCOSE: 93 mg/dL (ref 65–99)
POTASSIUM: 3.6 mmol/L (ref 3.5–5.1)
Phosphorus: 5.7 mg/dL — ABNORMAL HIGH (ref 2.5–4.6)
SODIUM: 141 mmol/L (ref 135–145)

## 2016-05-15 LAB — MAGNESIUM: Magnesium: 1.7 mg/dL (ref 1.7–2.4)

## 2016-05-15 NOTE — Progress Notes (Signed)
Subjective:   Patient was started on IV Lasix infusion on Monday  Objective:  Vital signs in last 24 hours:  Temperature 98.1, pulse 90, respirations 25, blood pressure 152/96  Intake/Output:   No intake or output data in the 24 hours ending 05/15/16 1735   Physical Exam: General: Chronically ill-appearing, sitting up in the chair  HEENT Anicteric, moist mucous membranes  Neck supple  Pulm/lungs Decrease breath sounds at bases  CVS/Heart Tachycardic, regular  Abdomen:  Distended, soft  Extremities: 2-3+ pitting edema up to the lower abdomen  Neurologic: Alert, oriented             Basic Metabolic Panel:   Recent Labs Lab 05/12/16 0821 05/13/16 0834 05/14/16 0701 05/14/16 0702 05/15/16 0706  NA 138 141  --  141 141  K 3.3* 3.7  --  3.6 3.6  CL 110 114*  --  112* 111  CO2 16* 16*  --  17* 17*  GLUCOSE 84 98  --  83 93  BUN 60* 57*  --  55* 53*  CREATININE 4.62* 4.70*  --  4.46* 4.68*  CALCIUM 7.0* 7.0*  --  7.0* 7.2*  MG 1.6* 1.6* 1.5*  --  1.7  PHOS 6.4* 5.3*  --  6.1* 5.7*     CBC:  Recent Labs Lab 05/12/16 0821 05/13/16 0834 05/14/16 0701  WBC 13.5* 12.7* 12.0*  NEUTROABS 11.5* 10.7*  --   HGB 7.3* 6.9* 7.8*  HCT 22.0* 21.4* 23.6*  MCV 82.1 84.6 82.8  PLT 403* 386 394     No results found for: HEPBSAG, HEPBSAB, HEPBIGM    Microbiology:  Recent Results (from the past 240 hour(s))  Culture, blood (routine x 2)     Status: None (Preliminary result)   Collection Time: 05/12/16  2:07 PM  Result Value Ref Range Status   Specimen Description BLOOD RIGHT HAND  Final   Special Requests IN PEDIATRIC BOTTLE BCAV  Final   Culture NO GROWTH 3 DAYS  Final   Report Status PENDING  Incomplete  Culture, blood (routine x 2)     Status: None (Preliminary result)   Collection Time: 05/12/16  2:07 PM  Result Value Ref Range Status   Specimen Description BLOOD RIGHT HAND  Final   Special Requests IN PEDIATRIC BOTTLE BCAV  Final   Culture NO GROWTH 3  DAYS  Final   Report Status PENDING  Incomplete    Coagulation Studies: No results for input(s): LABPROT, INR in the last 72 hours.  Urinalysis: No results for input(s): COLORURINE, LABSPEC, PHURINE, GLUCOSEU, HGBUR, BILIRUBINUR, KETONESUR, PROTEINUR, UROBILINOGEN, NITRITE, LEUKOCYTESUR in the last 72 hours.  Invalid input(s): APPERANCEUR    Imaging: No results found.   Medications:       Assessment/ Plan:  66 y.o.African male admitted on 05/11/2016 with Endocarditis.  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 Acute renal failure. Creatinine peaked at 7.84,  Today's creatinine is 4.7 Electrolytes and volume status is acceptable. No acute indication for Dialysis Patient states that if absolutely necessary, he'll be willing to take dialysis in future  2. Chronic kidney disease stage IV with Anasarca Baseline creatinine 4.2 ; GFRof 17 However, with poor muscle mass, his renal  function is lower than estimated GFR. Patient does have significant volume overload with ascites and lower extremity edema.   We will try to manage his fluid with IV Lasix infusion however there is a chance he might need dialysis this admission increase the Lasix to 10 mg per hr  3. Anemia Patient is getting ready to get blood transfusion today  4. Acidosis Likely secondary to renal failure  5. Bilateral renal cysts an exophytic left renal mass Evaluated by CT of abdomen without contrast on April 12 Mixed simple, complex proteinaceous/hemorrhagic renal cysts    exophytic focus In the interpolar region of left kidney.thought to be Ablation site. Other differential includes  angiomyolipoma.  2-D echo from April of 2018 from [facility] shows normal LVEF at 55-60%. Normal right ventricular size and function noted. No pericardial effusion.   LOS: 0 Megan Hayduk 4/25/20185:35 PM

## 2016-05-17 LAB — CULTURE, BLOOD (ROUTINE X 2)
CULTURE: NO GROWTH
CULTURE: NO GROWTH

## 2016-05-17 LAB — BASIC METABOLIC PANEL
Anion gap: 10 (ref 5–15)
BUN: 55 mg/dL — AB (ref 6–20)
CALCIUM: 7.4 mg/dL — AB (ref 8.9–10.3)
CO2: 18 mmol/L — AB (ref 22–32)
CREATININE: 4.97 mg/dL — AB (ref 0.61–1.24)
Chloride: 113 mmol/L — ABNORMAL HIGH (ref 101–111)
GFR calc Af Amer: 13 mL/min — ABNORMAL LOW (ref >60)
GFR calc non Af Amer: 11 mL/min — ABNORMAL LOW (ref >60)
GLUCOSE: 79 mg/dL (ref 65–99)
Potassium: 4.5 mmol/L (ref 3.5–5.1)
Sodium: 141 mmol/L (ref 135–145)

## 2016-05-17 NOTE — Progress Notes (Signed)
Subjective:   Patient was started on IV Lasix infusion on Monday Doing fair Breathing is Ok. Room air Appetite is poor to fair UOP with several unmeasured voids BUN/Cr is slightly worse  Objective:  Vital signs in last 24 hours:  Temperature 97.8, pulse 97, respirations 13, blood pressure 146/91  Physical Exam: General: Chronically ill-appearing,laying in bed  HEENT  moist mucous membranes  Neck supple  Pulm/lungs Decrease breath sounds at bases, room air  CVS/Heart Tachycardic, regular  Abdomen:  Distended, soft  Extremities: 2-3+ pitting edema up to the lower abdomen  Neurologic: Alert, oriented             Basic Metabolic Panel:   Recent Labs Lab 05/12/16 0821 05/13/16 0834 05/14/16 0701 05/14/16 0702 05/15/16 0706 05/17/16 0530  NA 138 141  --  141 141 141  K 3.3* 3.7  --  3.6 3.6 4.5  CL 110 114*  --  112* 111 113*  CO2 16* 16*  --  17* 17* 18*  GLUCOSE 84 98  --  83 93 79  BUN 60* 57*  --  55* 53* 55*  CREATININE 4.62* 4.70*  --  4.46* 4.68* 4.97*  CALCIUM 7.0* 7.0*  --  7.0* 7.2* 7.4*  MG 1.6* 1.6* 1.5*  --  1.7  --   PHOS 6.4* 5.3*  --  6.1* 5.7*  --      CBC:  Recent Labs Lab 05/12/16 0821 05/13/16 0834 05/14/16 0701  WBC 13.5* 12.7* 12.0*  NEUTROABS 11.5* 10.7*  --   HGB 7.3* 6.9* 7.8*  HCT 22.0* 21.4* 23.6*  MCV 82.1 84.6 82.8  PLT 403* 386 394     No results found for: HEPBSAG, HEPBSAB, HEPBIGM    Microbiology:  Recent Results (from the past 240 hour(s))  Culture, blood (routine x 2)     Status: None (Preliminary result)   Collection Time: 05/12/16  2:07 PM  Result Value Ref Range Status   Specimen Description BLOOD RIGHT HAND  Final   Special Requests IN PEDIATRIC BOTTLE BCAV  Final   Culture NO GROWTH 4 DAYS  Final   Report Status PENDING  Incomplete  Culture, blood (routine x 2)     Status: None (Preliminary result)   Collection Time: 05/12/16  2:07 PM  Result Value Ref Range Status   Specimen Description BLOOD RIGHT  HAND  Final   Special Requests IN PEDIATRIC BOTTLE BCAV  Final   Culture NO GROWTH 4 DAYS  Final   Report Status PENDING  Incomplete    Coagulation Studies: No results for input(s): LABPROT, INR in the last 72 hours.  Urinalysis: No results for input(s): COLORURINE, LABSPEC, PHURINE, GLUCOSEU, HGBUR, BILIRUBINUR, KETONESUR, PROTEINUR, UROBILINOGEN, NITRITE, LEUKOCYTESUR in the last 72 hours.  Invalid input(s): APPERANCEUR    Imaging: No results found.   Medications:       Assessment/ Plan:  66 y.o.African male admitted on 05/11/2016 with Endocarditis.  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 Acute renal failure. Creatinine peaked at 7.84,  Today's creatinine is 4.97 Electrolytes and volume status is acceptable. No acute indication for Dialysis Patient states that if absolutely necessary, he'll be willing to take dialysis in future  2. Chronic  kidney disease stage IV with Anasarca Baseline creatinine 4.2 ; GFRof 17 However, with poor muscle mass, his renal function is lower than estimated GFR. Patient does have significant volume overload with ascites and lower extremity edema.   We will try to manage his fluid with IV Lasix infusion however there is a chance he might need dialysis this admission continue Lasix grip at 10 mg per hr start low dose spironolactone   3. Anemia Patient is getting ready to get blood transfusion today  4. Acidosis Likely secondary to renal failure  5. Bilateral renal cysts an exophytic left renal mass Evaluated by CT of abdomen without contrast on April 12 Mixed simple, complex proteinaceous/hemorrhagic renal cysts     exophytic focus In the interpolar region of left kidney.thought to be Ablation site. Other differential includes angiomyolipoma.  2-D echo from April of 2018 from [facility] shows normal LVEF at 55-60%. Normal right ventricular size and function noted. No pericardial effusion.   LOS: 0 Russell [name] 4/27/20699:07 AM

## 2016-05-18 LAB — BASIC METABOLIC PANEL
Anion gap: 8 (ref 5–15)
BUN: 57 mg/dL — AB (ref 6–20)
CHLORIDE: 113 mmol/L — AB (ref 101–111)
CO2: 19 mmol/L — ABNORMAL LOW (ref 22–32)
CREATININE: 4.84 mg/dL — AB (ref 0.61–1.24)
Calcium: 7.5 mg/dL — ABNORMAL LOW (ref 8.9–10.3)
GFR calc Af Amer: 13 mL/min — ABNORMAL LOW (ref >60)
GFR calc non Af Amer: 12 mL/min — ABNORMAL LOW (ref >60)
Glucose, Bld: 96 mg/dL (ref 65–99)
Potassium: 4.3 mmol/L (ref 3.5–5.1)
SODIUM: 140 mmol/L (ref 135–145)

## 2016-05-19 LAB — CBC
HEMATOCRIT: 23 % — AB (ref 39.0–52.0)
Hemoglobin: 7.3 g/dL — ABNORMAL LOW (ref 13.0–17.0)
MCH: 27.3 pg (ref 26.0–34.0)
MCHC: 31.7 g/dL (ref 30.0–36.0)
MCV: 86.1 fL (ref 78.0–100.0)
Platelets: 401 10*3/uL — ABNORMAL HIGH (ref 150–400)
RBC: 2.67 MIL/uL — AB (ref 4.22–5.81)
RDW: 18 % — AB (ref 11.5–15.5)
WBC: 8.4 10*3/uL (ref 4.0–10.5)

## 2016-05-19 LAB — BASIC METABOLIC PANEL
Anion gap: 10 (ref 5–15)
BUN: 58 mg/dL — ABNORMAL HIGH (ref 6–20)
CALCIUM: 7.9 mg/dL — AB (ref 8.9–10.3)
CO2: 19 mmol/L — ABNORMAL LOW (ref 22–32)
Chloride: 113 mmol/L — ABNORMAL HIGH (ref 101–111)
Creatinine, Ser: 5.04 mg/dL — ABNORMAL HIGH (ref 0.61–1.24)
GFR calc Af Amer: 13 mL/min — ABNORMAL LOW (ref >60)
GFR, EST NON AFRICAN AMERICAN: 11 mL/min — AB (ref >60)
Glucose, Bld: 84 mg/dL (ref 65–99)
POTASSIUM: 4.8 mmol/L (ref 3.5–5.1)
SODIUM: 142 mmol/L (ref 135–145)

## 2016-05-20 NOTE — Progress Notes (Signed)
Subjective:  Patient still has significant anasarca. BUN currently 58 Creatinine up to 5.04. EGFR currently 13. We spent considerable time today talking about the potential need for renal replacement therapy. The patient is okay with proceeding with hemodialysis.   Objective:  Vital signs in last 24 hours:  Temperature 98.2 pulse 100 respirations 16 blood pressure 100/42/82  Physical Exam: General: Chronically ill-appearing,laying in bed  HEENT moist mucous membranes, hearing intact  Neck supple  Pulm/lungs Basilar rales, normal effort  CVS/Heart Tachycardic, regular  Abdomen:  Distended, soft, BS present  Extremities: 2-3+ pitting edema up to the lower abdomen  Neurologic: Alert, oriented, follows commands             Basic Metabolic Panel:   Recent Labs Lab 05/14/16 0701  05/14/16 0702 05/15/16 0706 05/17/16 0530 05/18/16 1028 05/19/16 0615  NA  --   --  141 141 141 140 142  K  --   --  3.6 3.6 4.5 4.3 4.8  CL  --   --  112* 111 113* 113* 113*  CO2  --   --  17* 17* 18* 19* 19*  GLUCOSE  --   --  83 93 79 96 84  BUN  --   --  55* 53* 55* 57* 58*  CREATININE  --   --  4.46* 4.68* 4.97* 4.84* 5.04*  CALCIUM  --   < > 7.0* 7.2* 7.4* 7.5* 7.9*  MG 1.5*  --   --  1.7  --   --   --   PHOS  --   --  6.1* 5.7*  --   --   --   < > = values in this interval not displayed.   CBC:  Recent Labs Lab 05/14/16 0701 05/19/16 0615  WBC 12.0* 8.4  HGB 7.8* 7.3*  HCT 23.6* 23.0*  MCV 82.8 86.1  PLT 394 401*     No results found for: HEPBSAG, HEPBSAB, HEPBIGM    Microbiology:  Recent Results (from the past 240 hour(s))  Culture, blood (routine x 2)     Status: None   Collection Time: 05/12/16  2:07 PM  Result Value Ref Range Status   Specimen Description BLOOD RIGHT HAND  Final   Special Requests IN PEDIATRIC BOTTLE BCAV  Final   Culture NO GROWTH 5 DAYS  Final   Report Status 05/17/2016 FINAL  Final  Culture, blood (routine x 2)     Status: None   Collection Time: 05/12/16  2:07 PM  Result Value Ref Range Status   Specimen Description BLOOD RIGHT HAND  Final   Special Requests IN PEDIATRIC BOTTLE BCAV  Final   Culture NO GROWTH 5 DAYS  Final   Report Status 05/17/2016 FINAL  Final    Coagulation Studies: No results for input(s): LABPROT, INR in the last 72 hours.  Urinalysis: No results for input(s): COLORURINE, LABSPEC, PHURINE, GLUCOSEU, HGBUR, BILIRUBINUR, KETONESUR, PROTEINUR, UROBILINOGEN, NITRITE, LEUKOCYTESUR in the last 72 hours.  Invalid input(s): APPERANCEUR    Imaging: No results found.   Medications:       Assessment/ Plan:  66 y.o.African male admitted on 05/11/2016 with Endocarditis.  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 Acute  renal failure. 2. Chronic kidney disease stage IV with Anasarca Baseline creatinine 4.2 ; GFRof 17 3. Anemia of CKD 4. Acidosis Likely secondary to renal failure 5. Bilateral renal cysts an exophytic left renal mass Evaluated by CT of abdomen without contrast on April 12 Mixed simple, complex proteinaceous/hemorrhagic renal cysts    exophytic focus In the interpolar region of left kidney.thought to be Ablation site. Other differential includes angiomyolipoma.  Plan:  Overall the patient continues to have very poor renal function overall. Creatinine today is 5.04 with an EGFR 13. Despite Lasix drip he has significant anasarca. His albumin is also quite low at 1.5. Had a long discussion with the patient today regarding renal placement therapy. He is open to this Therefore we will request PermCath placement for tomorrow. On Wednesday we will start the patient on  hemodialysis. Hopefully this will help to improve the patient's condition.   LOS: 0 Darrious Youman 4/30/20183:33 PM

## 2016-05-21 ENCOUNTER — Encounter (HOSPITAL_BASED_OUTPATIENT_CLINIC_OR_DEPARTMENT_OTHER): Payer: Medicare Other

## 2016-05-21 DIAGNOSIS — N184 Chronic kidney disease, stage 4 (severe): Secondary | ICD-10-CM

## 2016-05-21 NOTE — Progress Notes (Signed)
Bilateral Upper Extremity Vein Map  Right Cephalic Segment Diameter Depth Comment  1. Axilla 1.9 mm 10.5  mm   2. Mid upper arm 1.1 mm 2.4  mm   3. Above AC 1.3 mm 3.2 mm   4. In AC mm mm Chronically occluded  5. Below AC 3.3 mm 3.1 mm  branch  6. Mid forearm 1.8 mm 3 mm mtpl branch  7. Wrist 1.7 mm 2.2 mm  branch   Right Basilic Segment Diameter Depth Comment  1. Axilla mm mm   2. Mid upper arm mm mm   3. Above AC 2.2 mm 9.7 mm   4. In AC 1.7 mm 8.9 mm   5. Below AC 1.6 mm 6.9 mm   6. Mid forearm 0.6 mm 5.5 mm    Left Cephalic Segment Diameter Depth Comment  1. Axilla 3.9 mm 5.3 mm   2. Mid upper arm 2.2 mm 2.7 mm   3. Above AC 2.3 mm 3 mm   4. In AC 2.4 mm 2.7 mm   5. Below AC 2.7 mm 3.2 mm  branch  6. Mid forearm 1.9 mm 4.9 mm branch  7. Wrist 0.8 mm 2.5 mm    Left Baslic Segment Diameter Depth Comment  1. Axilla 6.5 mm 6.9 mm   2. Mid upper arm 4 mm 16.3 mm branch  3. Above AC 3.4 mm 13.5 mm  branch  4. In AC 2.9 mm 15.1 mm   5. Below AC 2.9 mm 11.1 mm   6. Mid forearm 2.7 mm 8.7 mm   7. 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  Lita Mains- RDMS, RVT 3:39 PM  05/21/2016

## 2016-05-22 NOTE — Consult Note (Signed)
Hospital Consult    Reason for Consult:  In need of tunneled dialysis catheter Referring Physician:  Candiss Norse MRN #:  [id number]  History of Present Illness: This is a 66 y.o. male with hx of HIV > 20 years, ARF on CRF, hepatitis C (denies hepatitis B), CAD, hx of Abdominal aortic aneurysm (rupture per care everywhere) and right iliac aneurysm, s/p endo vascular repair including vascular plug of the right internal iliac artery and also transposition of the left internal iliac artery subsequently followed up with placement of a bifurcating endo vascular stent graft.  He also has hx of substance abuse and bilateral complex renal cysts with left renal mass.  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    Pt has hx of MI, denies CVA.  [facility] is consulted for placement of tunneled dialysis catheter.    Past Medical History:  Diagnosis Date  . Abdominal aortic aneurysm ([place])   . Chronic hepatitis C ([place])   . Chronic renal insufficiency, stage II (mild)   . Hepatitis C carrier ([place])   . HIV (human immunodeficiency virus infection) ([place])   . Hypertension   . Renal cell cancer [facility])     Past Surgical History:  Procedure Laterality Date  . ABDOMINAL AORTIC ANEURYSM REPAIR    . HERNIA REPAIR      Allergies  Allergen Reactions  . Lactose Intolerance ([place])     Gas, diarrhea     Prior to Admission medications   Medication Sig Start Date End Date Taking? Authorizing Provider  abacavir-lamiVUDine (EPZICOM) 600-300 MG per tablet Take 1 tablet by mouth daily. For HIV infection 12/13/11   Encarnacion Slates, NP  ALPRAZolam Duanne Moron) 0.5 MG tablet Take 0.5 mg by mouth daily. 03/26/15   Historical Provider, MD  cloNIDine (CATAPRES) 0.2 MG tablet Take 1 tablet by mouth daily. 03/26/15   Historical Provider, MD  DULoxetine (CYMBALTA) 60 MG capsule Take 1 capsule by mouth daily.  03/26/15   Historical Provider, MD  efavirenz (SUSTIVA) 600 MG tablet Take 1 tablet (600 mg total) by mouth at bedtime. For HIV infection 12/13/11   Encarnacion Slates, NP  furosemide (LASIX) 40 MG tablet Take 1 tablet by mouth daily. 02/10/15   Historical Provider, MD  lisinopril-hydrochlorothiazide (PRINZIDE,ZESTORETIC) 20-25 MG per tablet Take 1 tablet by mouth daily. For high blood pressure control 12/13/11   Encarnacion Slates, NP  mirtazapine (REMERON) 30 MG tablet Take 1 tablet (30 mg total) by mouth at bedtime. For depression/sleep 12/13/11   Encarnacion Slates, NP  pantoprazole (PROTONIX) 40 MG tablet Take 1 tablet by mouth daily. 02/10/15   Historical Provider, MD  traZODone (DESYREL) 50 MG tablet Take 1 tablet (50 mg total) by mouth at bedtime as needed for sleep. For sleep 12/13/11   Encarnacion Slates, NP    Social History   Social History  . Marital status: Widowed    Spouse name: N/A  . Number of children: N/A  . Years of education: N/A   Occupational History  . Not on file.   Social History Main Topics  . Smoking status: Current Every Day Smoker    Packs/day: 1.50    Years: 2.00  . Smokeless tobacco: Not on file  . Alcohol use Yes     Comment: pint of liquor a day.  . Drug use: Yes    Types:  IV, Heroin     Comment: "shot some dope last night and this am. "heroin.""  . Sexual activity: No   Other Topics Concern  . Not on file   Social History Narrative  . No narrative on file    ROS: [x]  Positive   [ ]  Negative   [ ]  All sytems reviewed and are negative  Cardiovascular: []  chest pain/pressure []  palpitations []  SOB lying flat []  DOE []  pain in legs while walking []  pain in legs at rest []  pain in legs at night []  non-healing ulcers []  hx of DVT [x]  swelling in legs [x]  endocarditis/MSSA  Pulmonary: []  productive cough []  asthma/wheezing []  home O2  Neurologic: []  weakness in []  arms []  legs []  numbness in []  arms []  legs [x]  hx of CVA per care everywhere  (embolic)[]  mini stroke [] difficulty speaking or slurred speech []  temporary loss of vision in one eye []  dizziness  Hematologic: []  hx of cancer []  bleeding problems []  problems with blood clotting easily [x]  anemia  Endocrine:   []  diabetes []  thyroid disease  GI []  vomiting blood []  blood in stool  GU: [x]  CKD/renal failure []  HD--[]  M/W/F or []  T/T/S []  burning with urination []  blood in urine  Psychiatric: []  anxiety []  depression  Musculoskeletal: []  arthritis []  joint pain  Integumentary: []  rashes []  ulcers  Constitutional: []  fever []  chills   Physical Examination  General:  WDWN in NAD Gait: Not observed HENT: WNL, normocephalic Pulmonary: normal non-labored breathing, without Rales, rhonchi,  wheezing Cardiac: regular, without  Murmurs, rubs or gallops; with left carotid bruit Abdomen:  soft, NT/ND, no masses Skin: without rashes Vascular Exam/Pulses:  Right Left  Radial 2+ (normal) 2+ (normal)  Ulnar Unable to palpate  2+ (normal)  Popliteal Unable to palpate  Unable to palpate   DP Unable to palpate Unable to palpate   PT 2+ (normal) 2+ (normal)   Extremities: without ischemic changes, without Gangrene , without cellulitis; without open wounds;  2+ pitting edema BLE Musculoskeletal: no muscle wasting or atrophy  Neurologic: A&O X 3;  No focal weakness or paresthesias are detected; speech is fluent/normal Psychiatric:  The pt has Normal affect.  CBC    Component Value Date/Time   WBC 8.4 05/19/2016 0615   RBC 2.67 (L) 05/19/2016 0615   HGB 7.3 (L) 05/19/2016 0615   HCT 23.0 (L) 05/19/2016 0615   PLT 401 (H) 05/19/2016 0615   MCV 86.1 05/19/2016 0615   MCH 27.3 05/19/2016 0615   MCHC 31.7 05/19/2016 0615   RDW 18.0 (H) 05/19/2016 0615   LYMPHSABS 0.9 05/13/2016 0834   MONOABS 0.9 05/13/2016 0834   EOSABS 0.3 05/13/2016 0834   BASOSABS 0.0 05/13/2016 0834    BMET    Component Value Date/Time   NA 142 05/19/2016 0615   K  4.8 05/19/2016 0615   CL 113 (H) 05/19/2016 0615   CO2 19 (L) 05/19/2016 0615   GLUCOSE 84 05/19/2016 0615   BUN 58 (H) 05/19/2016 0615   CREATININE 5.04 (H) 05/19/2016 0615   CALCIUM 7.9 (L) 05/19/2016 0615   GFRNONAA 11 (L) 05/19/2016 0615   GFRAA 13 (L) 05/19/2016 0615    COAGS: Lab Results  Component Value Date   INR 1.35 05/12/2016     Non-Invasive Vascular Imaging:   Bilateral Upper Extremity Vein XTG-06/23/67  Right Cephalic Segment Diameter Depth Comment  1. Axilla 1.9 mm 10.5  mm   2. Mid upper arm 1.1 mm 2.4  mm  3. Above AC 1.3 mm 3.2 mm   4. In AC mm mm Chronically occluded  5. Below AC 3.3 mm 3.1 mm  branch  6. Mid forearm 1.8 mm 3 mm mtpl branch  7. Wrist 1.7 mm 2.2 mm  branch   Right Basilic Segment Diameter Depth Comment  1. Axilla mm mm   2. Mid upper arm mm mm   3. Above AC 2.2 mm 9.7 mm   4. In AC 1.7 mm 8.9 mm   5. Below AC 1.6 mm 6.9 mm   6. Mid forearm 0.6 mm 5.5 mm    Left Cephalic Segment Diameter Depth Comment  1. Axilla 3.9 mm 5.3 mm   2. Mid upper arm 2.2 mm 2.7 mm   3. Above AC 2.3 mm 3 mm   4. In AC 2.4 mm 2.7 mm   5. Below AC 2.7 mm 3.2 mm  branch  6. Mid forearm 1.9 mm 4.9 mm branch  7. Wrist 0.8 mm 2.5 mm    Left Baslic Segment Diameter Depth Comment  1. Axilla 6.5 mm 6.9 mm   2. Mid upper arm 4 mm 16.3 mm branch  3. Above AC 3.4 mm 13.5 mm  branch  4. In AC 2.9 mm 15.1 mm   5. Below AC 2.9 mm 11.1 mm   6. Mid forearm 2.7 mm 8.7 mm   7. 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  Carotid Duplex [facility] 05/02/16: Impressions Right Impression 1-39% stenosis in the RIGHT internal carotid artery. However, mild increase velocity at the distal ICA was observed felt to be related to some tortuousity than stenotic flow. There appears to be calcified plaque in the BULB. Left Impression 1-39% stenosis in the LEFT internal carotid artery.  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  Statin:  No. Beta Blocker:  Yes.   Aspirin:  Yes.   ACEI:  No. ARB:  No. CCB use:  Yes Other antiplatelets/anticoagulants:  No.    ASSESSMENT/PLAN: This is a 66 y.o. male who was admitted to [facility] for long term IV abx therapy on 05/11/16 with hx of endocarditis now with renal failure in need of tunneled dialysis catheter   -will plan for [facility] tomorrow 05/23/16 -npo after MN/consent/labs -pt with left carotid bruit-carotid duplex 05/02/16 reveals 1-39% stenosis bilaterally   Leontine Locket, PA-C [facility] [phone]  History and exam details as above.  Pt with history of IVDA primarily used left arm but also some in right.  He is very deconditioned and nutritionally depleted currently.  Vein mapping reviewed.  He may be a candidate for a left basilic vein fistula but will delay this for now until he is established on HD but may consider this next week as he improves.  Since he has active endocarditis would not place a graft currently if not a fistula candidate due to high risk of infection.  He currently has right IJ central line so will most likely do left side tunneled dialysis catheter.  Procedure risk benefits discussed with pt.  Chronic anemia will recheck lab work in [place], MD [facility] [place] Office: [phone] Pager: [phone]

## 2016-05-22 NOTE — Progress Notes (Signed)
Subjective:  No new creatinine today. At our last evaluation the patient did agree to undergo hemodialysis. Vascular surgery consultation for PermCath placement still pending.   Objective:  Vital signs in last 24 hours:  Temperature 98.4 pulse 105 respirations 16 blood pressure 159/92  Physical Exam: General: Chronically ill-appearing,laying in bed  HEENT moist mucous membranes, hearing intact  Neck supple  Pulm/lungs Basilar rales, normal effort  CVS/Heart Tachycardic, regular  Abdomen:  Distended, soft, BS present  Extremities: 2-3+ pitting edema up to the lower abdomen  Neurologic: Alert, oriented, follows commands             Basic Metabolic Panel:   Recent Labs Lab 05/15/16 0706 05/17/16 0530 05/18/16 1028 05/19/16 0615  NA 141 141 140 142  K 3.6 4.5 4.3 4.8  CL 111 113* 113* 113*  CO2 17* 18* 19* 19*  GLUCOSE 93 79 96 84  BUN 53* 55* 57* 58*  CREATININE 4.68* 4.97* 4.84* 5.04*  CALCIUM 7.2* 7.4* 7.5* 7.9*  MG 1.7  --   --   --   PHOS 5.7*  --   --   --      CBC:  Recent Labs Lab 05/19/16 0615  WBC 8.4  HGB 7.3*  HCT 23.0*  MCV 86.1  PLT 401*     No results found for: HEPBSAG, HEPBSAB, HEPBIGM    Microbiology:  Recent Results (from the past 240 hour(s))  Culture, blood (routine x 2)     Status: None   Collection Time: 05/12/16  2:07 PM  Result Value Ref Range Status   Specimen Description BLOOD RIGHT HAND  Final   Special Requests IN PEDIATRIC BOTTLE BCAV  Final   Culture NO GROWTH 5 DAYS  Final   Report Status 05/17/2016 FINAL  Final  Culture, blood (routine x 2)     Status: None   Collection Time: 05/12/16  2:07 PM  Result Value Ref Range Status   Specimen Description BLOOD RIGHT HAND  Final   Special Requests IN PEDIATRIC BOTTLE BCAV  Final   Culture NO GROWTH 5 DAYS  Final   Report Status 05/17/2016 FINAL  Final    Coagulation Studies: No results for input(s): LABPROT, INR in the last 72 hours.  Urinalysis: No results for  input(s): COLORURINE, LABSPEC, PHURINE, GLUCOSEU, HGBUR, BILIRUBINUR, KETONESUR, PROTEINUR, UROBILINOGEN, NITRITE, LEUKOCYTESUR in the last 72 hours.  Invalid input(s): APPERANCEUR    Imaging: No results found.   Medications:       Assessment/ Plan:  66 y.o.African male admitted on 05/11/2016 with Endocarditis.  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 Acute renal failure. 2. Chronic kidney disease stage IV with Anasarca Baseline creatinine 4.2 ; GFRof 17 3. Anemia of CKD 4. Acidosis Likely secondary to renal failure 5. Bilateral renal cysts an exophytic left renal mass Evaluated by CT of abdomen without contrast on April 12 Mixed simple, complex proteinaceous/hemorrhagic renal cysts    exophytic focus In the interpolar region of left kidney.thought to be Ablation site. Other differential includes angiomyolipoma.  Plan:  No new renal function testing today. However patient still has significant anasarca and overall poor renal function. The patient did agree to undergo hemodialysis. Plastic surgery consultation for  PermCath placement is still pending.We will request this again today.Hopefully we can perform hemodialysis starting Friday once PermCath has been placed.We will continue to monitor the patient's progress closely.   LOS: 0 Liliauna Santoni 5/2/20186:49 AM

## 2016-05-23 ENCOUNTER — Encounter: Payer: Self-pay | Admitting: Certified Registered Nurse Anesthetist

## 2016-05-23 ENCOUNTER — Other Ambulatory Visit (HOSPITAL_COMMUNITY): Payer: Medicare Other

## 2016-05-23 ENCOUNTER — Institutional Professional Consult (permissible substitution) (HOSPITAL_COMMUNITY): Payer: Medicare Other

## 2016-05-23 ENCOUNTER — Encounter (HOSPITAL_COMMUNITY): Payer: Medicare Other | Admitting: Certified Registered Nurse Anesthetist

## 2016-05-23 ENCOUNTER — Encounter (HOSPITAL_COMMUNITY)
Admission: RE | Disposition: A | Discharge status: Skilled Nursing Facility | Payer: Self-pay | Attending: Internal Medicine

## 2016-05-23 DIAGNOSIS — N185 Chronic kidney disease, stage 5: Secondary | ICD-10-CM

## 2016-05-23 HISTORY — PX: INSERTION OF DIALYSIS CATHETER: SHX1324

## 2016-05-23 LAB — BASIC METABOLIC PANEL
ANION GAP: 12 (ref 5–15)
BUN: 63 mg/dL — ABNORMAL HIGH (ref 6–20)
CHLORIDE: 110 mmol/L (ref 101–111)
CO2: 17 mmol/L — ABNORMAL LOW (ref 22–32)
Calcium: 7.5 mg/dL — ABNORMAL LOW (ref 8.9–10.3)
Creatinine, Ser: 5.52 mg/dL — ABNORMAL HIGH (ref 0.61–1.24)
GFR calc Af Amer: 11 mL/min — ABNORMAL LOW (ref >60)
GFR, EST NON AFRICAN AMERICAN: 10 mL/min — AB (ref >60)
GLUCOSE: 73 mg/dL (ref 65–99)
POTASSIUM: 4.7 mmol/L (ref 3.5–5.1)
SODIUM: 139 mmol/L (ref 135–145)

## 2016-05-23 LAB — CBC
HCT: 19.2 % — ABNORMAL LOW (ref 39.0–52.0)
HCT: 30.6 % — ABNORMAL LOW (ref 39.0–52.0)
HEMATOCRIT: 18.9 % — AB (ref 39.0–52.0)
HEMOGLOBIN: 5.9 g/dL — AB (ref 13.0–17.0)
Hemoglobin: 6.3 g/dL — CL (ref 13.0–17.0)
Hemoglobin: 10.3 g/dL — ABNORMAL LOW (ref 13.0–17.0)
MCH: 27.6 pg (ref 26.0–34.0)
MCH: 28.9 pg (ref 26.0–34.0)
MCH: 29.2 pg (ref 26.0–34.0)
MCHC: 31.2 g/dL (ref 30.0–36.0)
MCHC: 32.8 g/dL (ref 30.0–36.0)
MCHC: 33.7 g/dL (ref 30.0–36.0)
MCV: 88.3 fL (ref 78.0–100.0)
MCV: 88.1 fL (ref 78.0–100.0)
MCV: 86.7 fL (ref 78.0–100.0)
PLATELETS: 334 10*3/uL (ref 150–400)
Platelets: 381 10*3/uL (ref 150–400)
Platelets: 393 10*3/uL (ref 150–400)
RBC: 2.14 MIL/uL — ABNORMAL LOW (ref 4.22–5.81)
RBC: 2.18 MIL/uL — AB (ref 4.22–5.81)
RBC: 3.53 MIL/uL — ABNORMAL LOW (ref 4.22–5.81)
RDW: 19.3 % — ABNORMAL HIGH (ref 11.5–15.5)
RDW: 19.6 % — ABNORMAL HIGH (ref 11.5–15.5)
RDW: 18 % — AB (ref 11.5–15.5)
WBC: 7 10*3/uL (ref 4.0–10.5)
WBC: 7.5 10*3/uL (ref 4.0–10.5)
WBC: 6 10*3/uL (ref 4.0–10.5)

## 2016-05-23 LAB — SURGICAL PCR SCREEN
MRSA, PCR: NEGATIVE
Staphylococcus aureus: NEGATIVE

## 2016-05-23 LAB — OCCULT BLOOD X 1 CARD TO LAB, STOOL: Fecal Occult Bld: NEGATIVE

## 2016-05-23 LAB — PREPARE RBC (CROSSMATCH)

## 2016-05-23 LAB — HEPATITIS B SURFACE ANTIGEN: Hepatitis B Surface Ag: NEGATIVE

## 2016-05-23 LAB — HEPATITIS B CORE ANTIBODY, TOTAL: HEP B C TOTAL AB: POSITIVE — AB

## 2016-05-23 LAB — HEPATITIS B SURFACE ANTIBODY, QUANTITATIVE: Hepatitis B-Post: <3.1 m[IU]/mL — ABNORMAL LOW (ref >9.9)

## 2016-05-23 SURGERY — INSERTION OF DIALYSIS CATHETER
Anesthesia: Monitor Anesthesia Care | Site: Neck | Laterality: Left

## 2016-05-23 MED ORDER — PROPOFOL 500 MG/50ML IV EMUL
INTRAVENOUS | Status: DC | PRN
  Administered 2016-05-23: 50 ug/kg/min via INTRAVENOUS

## 2016-05-23 MED ORDER — CEFAZOLIN SODIUM-DEXTROSE 2-3 GM-% IV SOLR
INTRAVENOUS | Status: DC | PRN
  Administered 2016-05-23: 2 g via INTRAVENOUS

## 2016-05-23 MED ORDER — FENTANYL CITRATE (PF) 100 MCG/2ML IJ SOLN: 25.0000 ug | INTRAMUSCULAR | Status: DC | PRN

## 2016-05-23 MED ORDER — LIDOCAINE-EPINEPHRINE (PF) 1 %-1:200000 IJ SOLN
INTRAMUSCULAR | Status: DC | PRN
  Administered 2016-05-23: 20 mL

## 2016-05-23 MED ORDER — LIDOCAINE HCL 1 % IJ SOLN
INTRAMUSCULAR | Status: AC
  Filled 2016-05-23: qty 20

## 2016-05-23 MED ORDER — SODIUM CHLORIDE 0.9 % IV SOLN
INTRAVENOUS | Status: DC | PRN
Start: 2016-05-23 — End: 2016-05-23
  Administered 2016-05-23: 13:00:00 via INTRAVENOUS

## 2016-05-23 MED ORDER — PROMETHAZINE HCL 25 MG/ML IJ SOLN: 6.2500 mg | INTRAMUSCULAR | Status: DC | PRN

## 2016-05-23 MED ORDER — 0.9 % SODIUM CHLORIDE (POUR BTL) OPTIME
TOPICAL | Status: DC | PRN
  Administered 2016-05-23: 1000 mL

## 2016-05-23 MED ORDER — SODIUM CHLORIDE 0.9 % IV SOLN
INTRAVENOUS | Status: DC | PRN
  Administered 2016-05-23: 13:00:00

## 2016-05-23 MED ORDER — HEPARIN SODIUM (PORCINE) 1000 UNIT/ML IJ SOLN
INTRAMUSCULAR | Status: DC | PRN
  Administered 2016-05-23: 3800 [IU]

## 2016-05-23 MED ORDER — CEFAZOLIN SODIUM-DEXTROSE 2-4 GM/100ML-% IV SOLN
INTRAVENOUS | Status: AC
  Filled 2016-05-23: qty 100

## 2016-05-23 MED ORDER — HEPARIN SODIUM (PORCINE) 1000 UNIT/ML IJ SOLN
INTRAMUSCULAR | Status: AC
  Filled 2016-05-23: qty 1

## 2016-05-23 SURGICAL SUPPLY — 43 items
BAG DECANTER FOR FLEXI CONT (MISCELLANEOUS) ×3 IMPLANT
BIOPATCH RED 1 DISK 7.0 (GAUZE/BANDAGES/DRESSINGS) ×2 IMPLANT
BIOPATCH RED 1IN DISK 7.0MM (GAUZE/BANDAGES/DRESSINGS) ×1
CATH PALINDROME RT-P 15FX19CM (CATHETERS) IMPLANT
CATH PALINDROME RT-P 15FX23CM (CATHETERS) IMPLANT
CATH PALINDROME RT-P 15FX28CM (CATHETERS) ×3 IMPLANT
CATH PALINDROME RT-P 15FX55CM (CATHETERS) IMPLANT
CATH STRAIGHT 5FR 65CM (CATHETERS) ×3 IMPLANT
CHLORAPREP W/TINT 26ML (MISCELLANEOUS) ×3 IMPLANT
COVER PROBE W GEL 5X96 (DRAPES) ×3 IMPLANT
COVER SURGICAL LIGHT HANDLE (MISCELLANEOUS) ×3 IMPLANT
DERMABOND ADVANCED (GAUZE/BANDAGES/DRESSINGS) ×2
DERMABOND ADVANCED .7 DNX12 (GAUZE/BANDAGES/DRESSINGS) ×1 IMPLANT
DRAPE C-ARM 42X72 X-RAY (DRAPES) ×3 IMPLANT
DRAPE CHEST BREAST 15X10 FENES (DRAPES) ×3 IMPLANT
GAUZE SPONGE 4X4 12PLY STRL LF (GAUZE/BANDAGES/DRESSINGS) ×3 IMPLANT
GAUZE SPONGE 4X4 16PLY XRAY LF (GAUZE/BANDAGES/DRESSINGS) ×3 IMPLANT
GLOVE BIO SURGEON STRL SZ 6.5 (GLOVE) ×2 IMPLANT
GLOVE BIO SURGEON STRL SZ7.5 (GLOVE) ×3 IMPLANT
GLOVE BIO SURGEONS STRL SZ 6.5 (GLOVE) ×1
GLOVE BIOGEL PI IND STRL 6.5 (GLOVE) ×2 IMPLANT
GLOVE BIOGEL PI IND STRL 8 (GLOVE) ×1 IMPLANT
GLOVE BIOGEL PI INDICATOR 6.5 (GLOVE) ×4
GLOVE BIOGEL PI INDICATOR 8 (GLOVE) ×2
GOWN STRL REUS W/ TWL LRG LVL3 (GOWN DISPOSABLE) ×3 IMPLANT
GOWN STRL REUS W/TWL LRG LVL3 (GOWN DISPOSABLE) ×6
KIT BASIN OR (CUSTOM PROCEDURE TRAY) ×3 IMPLANT
KIT ROOM TURNOVER OR (KITS) ×3 IMPLANT
NEEDLE 18GX1X1/2 (RX/OR ONLY) (NEEDLE) ×3 IMPLANT
NEEDLE HYPO 25GX1X1/2 BEV (NEEDLE) ×3 IMPLANT
NS IRRIG 1000ML POUR BTL (IV SOLUTION) ×3 IMPLANT
PACK SURGICAL SETUP 50X90 (CUSTOM PROCEDURE TRAY) ×3 IMPLANT
PAD ARMBOARD 7.5X6 YLW CONV (MISCELLANEOUS) ×6 IMPLANT
SUT ETHILON 3 0 PS 1 (SUTURE) ×3 IMPLANT
SUT VICRYL 4-0 PS2 18IN ABS (SUTURE) ×3 IMPLANT
SYR 10ML LL (SYRINGE) IMPLANT
SYR 20CC LL (SYRINGE) ×6 IMPLANT
SYR 5ML LL (SYRINGE) ×6 IMPLANT
SYR CONTROL 10ML LL (SYRINGE) ×3 IMPLANT
TOWEL OR 17X24 6PK STRL BLUE (TOWEL DISPOSABLE) IMPLANT
TOWEL OR 17X26 10 PK STRL BLUE (TOWEL DISPOSABLE) ×3 IMPLANT
WATER STERILE IRR 1000ML POUR (IV SOLUTION) IMPLANT
WIRE AMPLATZ SS-J .035X180CM (WIRE) ×3 IMPLANT

## 2016-05-23 NOTE — Anesthesia Preprocedure Evaluation (Signed)
Anesthesia Evaluation  Patient identified by MRN, date of birth, ID band Patient awake    Reviewed: Allergy & Precautions, NPO status , Patient's Chart, lab work & pertinent test results  Airway Mallampati: II  TM Distance: >3 FB Neck ROM: Full    Dental no notable dental hx.    Pulmonary Current Smoker,    Pulmonary exam normal breath sounds clear to auscultation       Cardiovascular hypertension, + Peripheral Vascular Disease  Normal cardiovascular exam Rhythm:Regular Rate:Normal     Neuro/Psych negative neurological ROS  negative psych ROS   GI/Hepatic negative GI ROS, (+)     substance abuse  alcohol use, Hepatitis -, C  Endo/Other  negative endocrine ROS  Renal/GU   negative genitourinary   Musculoskeletal negative musculoskeletal ROS (+)   Abdominal   Peds negative pediatric ROS (+)  Hematology  (+) anemia , HIV,   Anesthesia Other Findings   Reproductive/Obstetrics negative OB ROS                             Anesthesia Physical Anesthesia Plan  ASA: IV  Anesthesia Plan: MAC   Post-op Pain Management:    Induction: Intravenous  Airway Management Planned: Simple Face Mask  Additional Equipment:   Intra-op Plan:   Post-operative Plan:   Informed Consent: I have reviewed the patients History and Physical, chart, labs and discussed the procedure including the risks, benefits and alternatives for the proposed anesthesia with the patient or authorized representative who has indicated his/her understanding and acceptance.   Dental advisory given  Plan Discussed with: CRNA and Surgeon  Anesthesia Plan Comments:         Anesthesia Quick Evaluation

## 2016-05-23 NOTE — Interval H&P Note (Signed)
History and Physical Interval Note:  05/23/2016 12:31 PM  Russell [name]  has presented today for surgery, with the diagnosis of Stage IV chronic kidney disease N18.4  The various methods of treatment have been discussed with the patient and family. After consideration of risks, benefits and other options for treatment, the patient has consented to  Procedure(s): INSERTION OF DIALYSIS CATHETER (N/A) as a surgical intervention .  The patient's history has been reviewed, patient examined, no change in status, stable for surgery.  I have reviewed the patient's chart and labs.  Questions were answered to the patient's satisfaction.     Deitra Mayo

## 2016-05-23 NOTE — Op Note (Signed)
    NAME: Russell [name]    MRN: [id number] DOB: 19-Mar-1950    DATE OF OPERATION: 05/23/2016  PREOP DIAGNOSIS:    Chronic kidney disease  POSTOP DIAGNOSIS:    Same  PROCEDURE:    Ultrasound-guided placement of left IJ tunneled dialysis catheter (28 cm)   SURGEON: Judeth Cornfield. 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 He had a triple-lumen catheter in his right IJ and therefore selected a left-sided approach.  FINDINGS:    Patent left IJ.  TECHNIQUE:    The patient was taken to the operating room and sedated by anesthesia. The patient had a catheter in the right neck and therefore I selected a left-sided approach. It appeared that the left IJ was patent. The left neck and chest were prepped and draped in the usual sterile fashion. Under ultrasound guidance, after the skin was anesthetized, the left IJ was cannulated and a guidewire introduced into the infrarenal aorta under fluoroscopic control. I then advanced the small dilator, but the larger dilator would not pass easily. I did not want to push this too hard given the angulation into the superior vena cava. I therefore exchanged the [facility] wire for an Amplatz wire using a straight catheter. Over the Amplatz were the tract was easily dilated. The dilator and peel-away sheath were advanced over the wire and the dilator removed. Catheter was threaded over the wire through the peel-away sheath down into the right atrium and the peel-away sheath and wire were removed. The exit site for the catheter was selected and the skin anesthetized between the 2 areas. The cath was brought to the tunnel, cut to the appropriate length, and the distal ports were attached. Both ports withdrew easily with and flushed with heparin saline and filled with concentrated heparin. The cath was  secured at its dissection site with a 3-0 nylon suture. The IJ cannulation site was closed with a 4-0 subcuticular stitch. Sterile dressing was applied. The patient tolerated the procedure well and was transferred to the recovery room in stable condition. All needle and sponge counts were correct.  Deitra Mayo, MD, FACS [facility] [place] DICTATION:   05/23/2016

## 2016-05-23 NOTE — Transfer of Care (Signed)
Immediate Anesthesia Transfer of Care Note  Patient: Russell [name]  Procedure(s) Performed: Procedure(s): INSERTION OF DIALYSIS CATHETER LEFT INTERNAL JUGULAR (Left)  Patient Location: PACU  Anesthesia Type:MAC  Level of Consciousness: awake, alert  and oriented  Airway & Oxygen Therapy: Patient Spontanous Breathing and Patient connected to face mask oxygen  Post-op Assessment: Report given to RN, Post -op Vital signs reviewed and stable and Patient moving all extremities X 4  Post vital signs: Reviewed and stable  Last Vitals:  Vitals:   05/23/16 1302 05/23/16 1305  BP:  (!) 152/80  Pulse: 96   Resp:  14  Temp:  36.9 C    Last Pain: There were no vitals filed for this visit.       Complications: No apparent anesthesia complications

## 2016-05-23 NOTE — H&P (View-Only) (Signed)
Hospital Consult    Reason for Consult:  In need of tunneled dialysis catheter Referring Physician:  Candiss Norse MRN #:  [id number]  History of Present Illness: This is a 66 y.o. male with hx of HIV > 20 years, ARF on CRF, hepatitis C (denies hepatitis B), CAD, hx of Abdominal aortic aneurysm (rupture per care everywhere) and right iliac aneurysm, s/p endo vascular repair including vascular plug of the right internal iliac artery and also transposition of the left internal iliac artery subsequently followed up with placement of a bifurcating endo vascular stent graft.  He also has hx of substance abuse and bilateral complex renal cysts with left renal mass.  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    Pt has hx of MI, denies CVA.  [facility] is consulted for placement of tunneled dialysis catheter.    Past Medical History:  Diagnosis Date  . Abdominal aortic aneurysm ([place])   . Chronic hepatitis C ([place])   . Chronic renal insufficiency, stage II (mild)   . Hepatitis C carrier ([place])   . HIV (human immunodeficiency virus infection) ([place])   . Hypertension   . Renal cell cancer [facility])     Past Surgical History:  Procedure Laterality Date  . ABDOMINAL AORTIC ANEURYSM REPAIR    . HERNIA REPAIR      Allergies  Allergen Reactions  . Lactose Intolerance ([place])     Gas, diarrhea     Prior to Admission medications   Medication Sig Start Date End Date Taking? Authorizing Provider  abacavir-lamiVUDine (EPZICOM) 600-300 MG per tablet Take 1 tablet by mouth daily. For HIV infection 12/13/11   Encarnacion Slates, NP  ALPRAZolam Duanne Moron) 0.5 MG tablet Take 0.5 mg by mouth daily. 03/26/15   Historical Provider, MD  cloNIDine (CATAPRES) 0.2 MG tablet Take 1 tablet by mouth daily. 03/26/15   Historical Provider, MD  DULoxetine (CYMBALTA) 60 MG capsule Take 1 capsule by mouth daily.  03/26/15   Historical Provider, MD  efavirenz (SUSTIVA) 600 MG tablet Take 1 tablet (600 mg total) by mouth at bedtime. For HIV infection 12/13/11   Encarnacion Slates, NP  furosemide (LASIX) 40 MG tablet Take 1 tablet by mouth daily. 02/10/15   Historical Provider, MD  lisinopril-hydrochlorothiazide (PRINZIDE,ZESTORETIC) 20-25 MG per tablet Take 1 tablet by mouth daily. For high blood pressure control 12/13/11   Encarnacion Slates, NP  mirtazapine (REMERON) 30 MG tablet Take 1 tablet (30 mg total) by mouth at bedtime. For depression/sleep 12/13/11   Encarnacion Slates, NP  pantoprazole (PROTONIX) 40 MG tablet Take 1 tablet by mouth daily. 02/10/15   Historical Provider, MD  traZODone (DESYREL) 50 MG tablet Take 1 tablet (50 mg total) by mouth at bedtime as needed for sleep. For sleep 12/13/11   Encarnacion Slates, NP    Social History   Social History  . Marital status: Widowed    Spouse name: N/A  . Number of children: N/A  . Years of education: N/A   Occupational History  . Not on file.   Social History Main Topics  . Smoking status: Current Every Day Smoker    Packs/day: 1.50    Years: 2.00  . Smokeless tobacco: Not on file  . Alcohol use Yes     Comment: pint of liquor a day.  . Drug use: Yes    Types:  IV, Heroin     Comment: "shot some dope last night and this am. "heroin.""  . Sexual activity: No   Other Topics Concern  . Not on file   Social History Narrative  . No narrative on file    ROS: [x]  Positive   [ ]  Negative   [ ]  All sytems reviewed and are negative  Cardiovascular: []  chest pain/pressure []  palpitations []  SOB lying flat []  DOE []  pain in legs while walking []  pain in legs at rest []  pain in legs at night []  non-healing ulcers []  hx of DVT [x]  swelling in legs [x]  endocarditis/MSSA  Pulmonary: []  productive cough []  asthma/wheezing []  home O2  Neurologic: []  weakness in []  arms []  legs []  numbness in []  arms []  legs [x]  hx of CVA per care everywhere  (embolic)[]  mini stroke [] difficulty speaking or slurred speech []  temporary loss of vision in one eye []  dizziness  Hematologic: []  hx of cancer []  bleeding problems []  problems with blood clotting easily [x]  anemia  Endocrine:   []  diabetes []  thyroid disease  GI []  vomiting blood []  blood in stool  GU: [x]  CKD/renal failure []  HD--[]  M/W/F or []  T/T/S []  burning with urination []  blood in urine  Psychiatric: []  anxiety []  depression  Musculoskeletal: []  arthritis []  joint pain  Integumentary: []  rashes []  ulcers  Constitutional: []  fever []  chills   Physical Examination  General:  WDWN in NAD Gait: Not observed HENT: WNL, normocephalic Pulmonary: normal non-labored breathing, without Rales, rhonchi,  wheezing Cardiac: regular, without  Murmurs, rubs or gallops; with left carotid bruit Abdomen:  soft, NT/ND, no masses Skin: without rashes Vascular Exam/Pulses:  Right Left  Radial 2+ (normal) 2+ (normal)  Ulnar Unable to palpate  2+ (normal)  Popliteal Unable to palpate  Unable to palpate   DP Unable to palpate Unable to palpate   PT 2+ (normal) 2+ (normal)   Extremities: without ischemic changes, without Gangrene , without cellulitis; without open wounds;  2+ pitting edema BLE Musculoskeletal: no muscle wasting or atrophy  Neurologic: A&O X 3;  No focal weakness or paresthesias are detected; speech is fluent/normal Psychiatric:  The pt has Normal affect.  CBC    Component Value Date/Time   WBC 8.4 05/19/2016 0615   RBC 2.67 (L) 05/19/2016 0615   HGB 7.3 (L) 05/19/2016 0615   HCT 23.0 (L) 05/19/2016 0615   PLT 401 (H) 05/19/2016 0615   MCV 86.1 05/19/2016 0615   MCH 27.3 05/19/2016 0615   MCHC 31.7 05/19/2016 0615   RDW 18.0 (H) 05/19/2016 0615   LYMPHSABS 0.9 05/13/2016 0834   MONOABS 0.9 05/13/2016 0834   EOSABS 0.3 05/13/2016 0834   BASOSABS 0.0 05/13/2016 0834    BMET    Component Value Date/Time   NA 142 05/19/2016 0615   K  4.8 05/19/2016 0615   CL 113 (H) 05/19/2016 0615   CO2 19 (L) 05/19/2016 0615   GLUCOSE 84 05/19/2016 0615   BUN 58 (H) 05/19/2016 0615   CREATININE 5.04 (H) 05/19/2016 0615   CALCIUM 7.9 (L) 05/19/2016 0615   GFRNONAA 11 (L) 05/19/2016 0615   GFRAA 13 (L) 05/19/2016 0615    COAGS: Lab Results  Component Value Date   INR 1.35 05/12/2016     Non-Invasive Vascular Imaging:   Bilateral Upper Extremity Vein VQQ-05/30/54  Right Cephalic Segment Diameter Depth Comment  1. Axilla 1.9 mm 10.5  mm   2. Mid upper arm 1.1 mm 2.4  mm  3. Above AC 1.3 mm 3.2 mm   4. In AC mm mm Chronically occluded  5. Below AC 3.3 mm 3.1 mm  branch  6. Mid forearm 1.8 mm 3 mm mtpl branch  7. Wrist 1.7 mm 2.2 mm  branch   Right Basilic Segment Diameter Depth Comment  1. Axilla mm mm   2. Mid upper arm mm mm   3. Above AC 2.2 mm 9.7 mm   4. In AC 1.7 mm 8.9 mm   5. Below AC 1.6 mm 6.9 mm   6. Mid forearm 0.6 mm 5.5 mm    Left Cephalic Segment Diameter Depth Comment  1. Axilla 3.9 mm 5.3 mm   2. Mid upper arm 2.2 mm 2.7 mm   3. Above AC 2.3 mm 3 mm   4. In AC 2.4 mm 2.7 mm   5. Below AC 2.7 mm 3.2 mm  branch  6. Mid forearm 1.9 mm 4.9 mm branch  7. Wrist 0.8 mm 2.5 mm    Left Baslic Segment Diameter Depth Comment  1. Axilla 6.5 mm 6.9 mm   2. Mid upper arm 4 mm 16.3 mm branch  3. Above AC 3.4 mm 13.5 mm  branch  4. In AC 2.9 mm 15.1 mm   5. Below AC 2.9 mm 11.1 mm   6. Mid forearm 2.7 mm 8.7 mm   7. 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  Carotid Duplex [facility] 05/02/16: Impressions Right Impression 1-39% stenosis in the RIGHT internal carotid artery. However, mild increase velocity at the distal ICA was observed felt to be related to some tortuousity than stenotic flow. There appears to be calcified plaque in the BULB. Left Impression 1-39% stenosis in the LEFT internal carotid artery.  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  Statin:  No. Beta Blocker:  Yes.   Aspirin:  Yes.   ACEI:  No. ARB:  No. CCB use:  Yes Other antiplatelets/anticoagulants:  No.    ASSESSMENT/PLAN: This is a 66 y.o. male who was admitted to [facility] for long term IV abx therapy on 05/11/16 with hx of endocarditis now with renal failure in need of tunneled dialysis catheter   -will plan for [facility] tomorrow 05/23/16 -npo after MN/consent/labs -pt with left carotid bruit-carotid duplex 05/02/16 reveals 1-39% stenosis bilaterally   Leontine Locket, PA-C [facility] [phone]  History and exam details as above.  Pt with history of IVDA primarily used left arm but also some in right.  He is very deconditioned and nutritionally depleted currently.  Vein mapping reviewed.  He may be a candidate for a left basilic vein fistula but will delay this for now until he is established on HD but may consider this next week as he improves.  Since he has active endocarditis would not place a graft currently if not a fistula candidate due to high risk of infection.  He currently has right IJ central line so will most likely do left side tunneled dialysis catheter.  Procedure risk benefits discussed with pt.  Chronic anemia will recheck lab work in [place], MD [facility] [place] Office: [phone] Pager: [phone]

## 2016-05-23 NOTE — Progress Notes (Signed)
Pt came to PACU with #2 unit PRBC infusing-approx 200 cc left in bag.

## 2016-05-23 NOTE — Progress Notes (Signed)
Blood transfusion finished 1320 total If 350 ml

## 2016-05-23 NOTE — Anesthesia Postprocedure Evaluation (Addendum)
Anesthesia Post Note  Patient: Russell [name]  Procedure(s) Performed: Procedure(s) (LRB): INSERTION OF DIALYSIS CATHETER LEFT INTERNAL JUGULAR (Left)  Patient location during evaluation: PACU Anesthesia Type: MAC Level of consciousness: awake and alert Pain management: pain level controlled Vital Signs Assessment: post-procedure vital signs reviewed and stable Respiratory status: spontaneous breathing, nonlabored ventilation, respiratory function stable and patient connected to nasal cannula oxygen Cardiovascular status: stable and blood pressure returned to baseline Anesthetic complications: no       Last Vitals:  Vitals:   05/23/16 1423 05/23/16 1435  BP:  (!) 152/87  Pulse: 90 89  Resp: 10 13  Temp: 36.6 C     Last Pain:  Vitals:   05/23/16 1435  PainSc: 0-No pain                 Kyndle Schlender S

## 2016-05-24 ENCOUNTER — Encounter (HOSPITAL_COMMUNITY): Payer: Self-pay | Admitting: Vascular Surgery

## 2016-05-24 LAB — RENAL FUNCTION PANEL
ALBUMIN: 1.1 g/dL — AB (ref 3.5–5.0)
ANION GAP: 14 (ref 5–15)
BUN: 50 mg/dL — AB (ref 6–20)
CALCIUM: 6.6 mg/dL — AB (ref 8.9–10.3)
CO2: 17 mmol/L — ABNORMAL LOW (ref 22–32)
Chloride: 111 mmol/L (ref 101–111)
Creatinine, Ser: 4.47 mg/dL — ABNORMAL HIGH (ref 0.61–1.24)
GFR calc Af Amer: 15 mL/min — ABNORMAL LOW (ref >60)
GFR, EST NON AFRICAN AMERICAN: 13 mL/min — AB (ref >60)
GLUCOSE: 73 mg/dL (ref 65–99)
PHOSPHORUS: 20 mg/dL — AB (ref 2.5–4.6)
Potassium: 3.8 mmol/L (ref 3.5–5.1)
SODIUM: 142 mmol/L (ref 135–145)

## 2016-05-24 LAB — CBC WITH DIFFERENTIAL/PLATELET
BASOS ABS: 0 10*3/uL (ref 0.0–0.1)
BASOS PCT: 0 %
Eosinophils Absolute: 0.3 10*3/uL (ref 0.0–0.7)
Eosinophils Relative: 4 %
HEMATOCRIT: 22.8 % — AB (ref 39.0–52.0)
Hemoglobin: 7.4 g/dL — ABNORMAL LOW (ref 13.0–17.0)
LYMPHS PCT: 15 %
Lymphs Abs: 1 10*3/uL (ref 0.7–4.0)
MCH: 28.6 pg (ref 26.0–34.0)
MCHC: 32.5 g/dL (ref 30.0–36.0)
MCV: 88 fL (ref 78.0–100.0)
MONO ABS: 0.4 10*3/uL (ref 0.1–1.0)
Monocytes Relative: 7 %
NEUTROS ABS: 4.9 10*3/uL (ref 1.7–7.7)
NEUTROS PCT: 74 %
Platelets: 251 10*3/uL (ref 150–400)
RBC: 2.59 MIL/uL — AB (ref 4.22–5.81)
RDW: 18 % — AB (ref 11.5–15.5)
WBC: 6.6 10*3/uL (ref 4.0–10.5)

## 2016-05-24 LAB — MAGNESIUM: Magnesium: 1.5 mg/dL — ABNORMAL LOW (ref 1.7–2.4)

## 2016-05-24 NOTE — Progress Notes (Signed)
Subjective:   Patient has now been started on hemodialysis. He was seen during his second treatment. Blood flow rate 150, DFR 300. He is tolerating it well. 600 cc of fluid was removed. He continues to have massive amounts of edema Denies breathing trouble. He is requiring oxygen by nasal cannula Appetite remains poor  Objective:  Vital signs in last 24 hours:    temperature 98.9, pulse 113,respirations 13 blood pressure 147/88  Weight change:  Filed Weights   05/23/16 1316  Weight: 76.1 kg (167 lb 12.8 oz)    Intake/Output:   No intake or output data in the 24 hours ending 05/24/16 1520   Physical Exam: General: Chronically ill appearing, laying in the bed  HEENT Moist mucous membranes, hearing intact  Neck supple  Pulm/lungs Oxygen by nasal cannula, normal effort,decreased breath sounds at bases  CVS/Heart Tachycardic, regular  Abdomen:  Distended, soft  Extremities: Anasarca, 2-3+ pitting edema up to lower abdomen  Neurologic: Alert, able to follow commands  Skin: no acute rashes  Access: Left IJ perm       Basic Metabolic Panel:   Recent Labs Lab 05/18/16 1028 05/19/16 0615 05/23/16 0558 05/24/16 0530  NA 140 142 139 142  K 4.3 4.8 4.7 3.8  CL 113* 113* 110 111  CO2 19* 19* 17* 17*  GLUCOSE 96 84 73 73  BUN 57* 58* 63* 50*  CREATININE 4.84* 5.04* 5.52* 4.47*  CALCIUM 7.5* 7.9* 7.5* 6.6*  MG  --   --   --  1.5*  PHOS  --   --   --  20.0*     CBC:  Recent Labs Lab 05/19/16 0615 05/23/16 0558 05/23/16 0804 05/23/16 1845 05/24/16 0530  WBC 8.4 7.0 7.5 6.0 6.6  NEUTROABS  --   --   --   --  4.9  HGB 7.3* 5.9* 6.3* 10.3* 7.4*  HCT 23.0* 18.9* 19.2* 30.6* 22.8*  MCV 86.1 88.3 88.1 86.7 88.0  PLT 401* 381 393 334 251     Lab Results  Component Value Date   HEPBSAG Negative 05/22/2016      Microbiology:  Recent Results (from the past 240 hour(s))  Surgical pcr screen     Status: None   Collection Time: 05/23/16  5:35 PM  Result Value  Ref Range Status   MRSA, PCR NEGATIVE NEGATIVE Final   Staphylococcus aureus NEGATIVE NEGATIVE Final    Comment:        The Xpert SA Assay (FDA approved for NASAL specimens in patients over 64 years of age), is one component of a comprehensive surveillance program.  Test performance has been validated by [facility] for patients greater than or equal to 30 year old. It is not intended to diagnose infection nor to guide or monitor treatment.     Coagulation Studies: No results for input(s): LABPROT, INR in the last 72 hours.  Urinalysis: No results for input(s): COLORURINE, LABSPEC, PHURINE, GLUCOSEU, HGBUR, BILIRUBINUR, KETONESUR, PROTEINUR, UROBILINOGEN, NITRITE, LEUKOCYTESUR in the last 72 hours.  Invalid input(s): APPERANCEUR    Imaging: Dg Chest Port 1 View  Result Date: 05/23/2016 CLINICAL DATA:  Dialysis catheter insertion EXAM: PORTABLE CHEST 1 VIEW COMPARISON:  05/13/2016 FINDINGS: Left jugular dual-lumen catheter has been placed with the tip in the right atrium. No pneumothorax. Right jugular central venous catheter tip in the SVC. Mild atelectasis in the bases. Negative for pulmonary edema. Small bilateral pleural effusions. IMPRESSION: Satisfactory dual-lumen dialysis catheter placement with the tip in the right  atrium Mild bibasilar atelectasis and small effusions.  Negative for edema. Electronically Signed   By: Franchot Gallo M.D.   On: 05/23/2016 14:33   Dg Fluoro Guide Cv Line-no Report  Result Date: 05/23/2016 Fluoroscopy was utilized by the requesting physician.  No radiographic interpretation.     Medications:       Assessment/ Plan:  66 y.o.African American male  admitted on 4/21/2018with Endocarditis.  Patient has HIV for more than 20 years, he has been compliant with his medications, hepatitis B, hepatitis C, coronary disease, chronic back pain, stage IV chronic kidney disease, history of polysubstance abuse in the past, bilateral complex cysts with  left renal mass, abdominal aortic aneurysm repair, hernia repair, endocarditis diagnosed in April 2018  1. Acute renal failure. 2. Chronic kidney disease stage IV with Anasarca Baseline creatinine 4.2 ; GFR of 17 3. Anemia of CKD 4. Acidosis Likely secondary to renal failure 5. Bilateral renal cysts an exophytic left renal mass Evaluated by CT of abdomen without contrast on April 12 Mixed simple, complex proteinaceous/hemorrhagic renal cysts  exophytic focus In the interpolar region of left kidney.thought to be Ablation site. Other differential includes angiomyolipoma.  Plan:  Appreciated vascular surgery assistance in placing left IJ tunneled dialysis catheter. It was placed in the OR on May 3 by Dr. Joylene Igo.  Patient underwent first dialysis treatment yesterday. He received 2 units of blood transfusion. tolerated second treatment today.600 cc of fluid was removed.  We plan to Continue daily dialysis next week in hope of removing more fluid. Next HD treatment tomorrow, then Monday blood pressure control is acceptable. Continue on amlodipine, hydralazine, labetalol, As more fluid is removed, some antihypertensive medications may need to be stopped     LOS: 0 Zamyra Allensworth 5/4/20183:20 PM

## 2016-05-25 LAB — CBC
HEMATOCRIT: 22.4 % — AB (ref 39.0–52.0)
HEMOGLOBIN: 7.3 g/dL — AB (ref 13.0–17.0)
MCH: 28.2 pg (ref 26.0–34.0)
MCHC: 32.6 g/dL (ref 30.0–36.0)
MCV: 86.5 fL (ref 78.0–100.0)
Platelets: 257 10*3/uL (ref 150–400)
RBC: 2.59 MIL/uL — ABNORMAL LOW (ref 4.22–5.81)
RDW: 18.2 % — ABNORMAL HIGH (ref 11.5–15.5)
WBC: 6.1 10*3/uL (ref 4.0–10.5)

## 2016-05-25 LAB — RENAL FUNCTION PANEL
Albumin: 1.1 g/dL — ABNORMAL LOW (ref 3.5–5.0)
Anion gap: 11 (ref 5–15)
BUN: 47 mg/dL — AB (ref 6–20)
CHLORIDE: 105 mmol/L (ref 101–111)
CO2: 21 mmol/L — AB (ref 22–32)
Calcium: 7.1 mg/dL — ABNORMAL LOW (ref 8.9–10.3)
Creatinine, Ser: 4.4 mg/dL — ABNORMAL HIGH (ref 0.61–1.24)
GFR calc Af Amer: 15 mL/min — ABNORMAL LOW (ref >60)
GFR, EST NON AFRICAN AMERICAN: 13 mL/min — AB (ref >60)
GLUCOSE: 80 mg/dL (ref 65–99)
POTASSIUM: 4 mmol/L (ref 3.5–5.1)
Phosphorus: 5 mg/dL — ABNORMAL HIGH (ref 2.5–4.6)
Sodium: 137 mmol/L (ref 135–145)

## 2016-05-25 LAB — MAGNESIUM: MAGNESIUM: 1.6 mg/dL — AB (ref 1.7–2.4)

## 2016-05-27 LAB — CBC
HCT: 21.7 % — ABNORMAL LOW (ref 39.0–52.0)
Hemoglobin: 7 g/dL — ABNORMAL LOW (ref 13.0–17.0)
MCH: 28.2 pg (ref 26.0–34.0)
MCHC: 31.8 g/dL (ref 30.0–36.0)
MCV: 88.6 fL (ref 78.0–100.0)
PLATELETS: 239 10*3/uL (ref 150–400)
RBC: 2.45 MIL/uL — AB (ref 4.22–5.81)
RDW: 18.1 % — ABNORMAL HIGH (ref 11.5–15.5)
WBC: 5.7 10*3/uL (ref 4.0–10.5)

## 2016-05-27 LAB — TYPE AND SCREEN
ABO/RH(D): B POS
ANTIBODY SCREEN: NEGATIVE
UNIT DIVISION: 0
UNIT DIVISION: 0
Unit division: 0

## 2016-05-27 LAB — RENAL FUNCTION PANEL
Albumin: 1.4 g/dL — ABNORMAL LOW (ref 3.5–5.0)
Anion gap: 9 (ref 5–15)
BUN: 29 mg/dL — ABNORMAL HIGH (ref 6–20)
CALCIUM: 7.5 mg/dL — AB (ref 8.9–10.3)
CO2: 23 mmol/L (ref 22–32)
CREATININE: 3.52 mg/dL — AB (ref 0.61–1.24)
Chloride: 104 mmol/L (ref 101–111)
GFR, EST AFRICAN AMERICAN: 20 mL/min — AB (ref >60)
GFR, EST NON AFRICAN AMERICAN: 17 mL/min — AB (ref >60)
Glucose, Bld: 116 mg/dL — ABNORMAL HIGH (ref 65–99)
Phosphorus: 3.7 mg/dL (ref 2.5–4.6)
Potassium: 3.9 mmol/L (ref 3.5–5.1)
SODIUM: 136 mmol/L (ref 135–145)

## 2016-05-27 LAB — BPAM RBC
BLOOD PRODUCT EXPIRATION DATE: 201805242359
Blood Product Expiration Date: 201805242359
Blood Product Expiration Date: 201805242359
ISSUE DATE / TIME: 201805031303
ISSUE DATE / TIME: 201805031139
ISSUE DATE / TIME: 201805031303
UNIT TYPE AND RH: 7300
Unit Type and Rh: 7300
Unit Type and Rh: 7300

## 2016-05-27 NOTE — Progress Notes (Signed)
Subjective:  Renal function has not improved significantly. Patient completed hemodialysis today. Ultrafiltration she was 2 kg. The patient still has considerable edema.   Objective:  Vital signs in last 24 hours:  Temperature 98.7 pulse 106 respirations 20 blood pressure 100/48/85    Physical Exam: General: Chronically ill appearing, sitting up  HEENT Moist mucous membranes, hearing intact  Neck supple  Pulm/lungs Diminished at bases, normal effort  CVS/Heart Tachycardic, regular  Abdomen:  Distended, soft  Extremities: Anasarca, 2-3+ pitting edema up to lower abdomen  Neurologic: Alert, able to follow commands  Skin: no acute rashes  Access: Left IJ perm       Basic Metabolic Panel:   Recent Labs Lab 05/23/16 0558 05/24/16 0530 05/25/16 0709  NA 139 142 137  K 4.7 3.8 4.0  CL 110 111 105  CO2 17* 17* 21*  GLUCOSE 73 73 80  BUN 63* 50* 47*  CREATININE 5.52* 4.47* 4.40*  CALCIUM 7.5* 6.6* 7.1*  MG  --  1.5* 1.6*  PHOS  --  20.0* 5.0*     CBC:  Recent Labs Lab 05/23/16 0558 05/23/16 0804 05/23/16 1845 05/24/16 0530 05/25/16 0709  WBC 7.0 7.5 6.0 6.6 6.1  NEUTROABS  --   --   --  4.9  --   HGB 5.9* 6.3* 10.3* 7.4* 7.3*  HCT 18.9* 19.2* 30.6* 22.8* 22.4*  MCV 88.3 88.1 86.7 88.0 86.5  PLT 381 393 334 251 257      Lab Results  Component Value Date   HEPBSAG Negative 05/22/2016      Microbiology:  Recent Results (from the past 240 hour(s))  Surgical pcr screen     Status: None   Collection Time: 05/23/16  5:35 PM  Result Value Ref Range Status   MRSA, PCR NEGATIVE NEGATIVE Final   Staphylococcus aureus NEGATIVE NEGATIVE Final    Comment:        The Xpert SA Assay (FDA approved for NASAL specimens in patients over 84 years of age), is one component of a comprehensive surveillance program.  Test performance has been validated by [facility] for patients greater than or equal to 51 year old. It is not intended to diagnose infection  nor to guide or monitor treatment.     Coagulation Studies: No results for input(s): LABPROT, INR in the last 72 hours.  Urinalysis: No results for input(s): COLORURINE, LABSPEC, PHURINE, GLUCOSEU, HGBUR, BILIRUBINUR, KETONESUR, PROTEINUR, UROBILINOGEN, NITRITE, LEUKOCYTESUR in the last 72 hours.  Invalid input(s): APPERANCEUR    Imaging: No results found.   Medications:       Assessment/ Plan:  66 y.o.African American male  admitted on 4/21/2018with Endocarditis.  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 Acute renal failure. 2. Chronic kidney disease stage IV with Anasarca Baseline creatinine 4.2 ; GFR of 17 3. Anemia of CKD 4. Acidosis Likely secondary to renal failure 5. Bilateral renal cysts an exophytic left renal mass Evaluated by CT of abdomen without contrast on April 12 Mixed simple, complex proteinaceous/hemorrhagic renal cysts  exophytic focus In the interpolar region of left kidney.thought to be Ablation site. Other differential includes angiomyolipoma.  Plan:  Patient completed hemodialysis today successfully. Ultrafiltration and she was 2 kg.  We will plan for another dialysis session on Wednesday. Again we will set a target ultrafiltration of 2 kg using albumin for blood pressure support. Continue to monitor hemoglobin his most recent hemoglobin was 7.3. Consider blood transfusion for hemoglobin of 7 or less. Metabolic acidosis has improved with most recent serum bicarbonate of 21. Hopefully we can continue to improve his anasarca with continued ultrafiltration.     LOS: 0 Russell [name] 5/7/20184:33 PM

## 2016-05-28 NOTE — Consult Note (Signed)
Permanent access when deemed necessary by Dr Holley Raring. Otherwise will sign off at this point  Ruta Hinds, MD [facility] [place] Office: [phone] Pager: [phone]

## 2016-05-29 LAB — RENAL FUNCTION PANEL
ALBUMIN: 1.2 g/dL — AB (ref 3.5–5.0)
ANION GAP: 12 (ref 5–15)
BUN: 34 mg/dL — ABNORMAL HIGH (ref 6–20)
CALCIUM: 7.1 mg/dL — AB (ref 8.9–10.3)
CO2: 22 mmol/L (ref 22–32)
Chloride: 106 mmol/L (ref 101–111)
Creatinine, Ser: 3.95 mg/dL — ABNORMAL HIGH (ref 0.61–1.24)
GFR calc non Af Amer: 15 mL/min — ABNORMAL LOW (ref >60)
GFR, EST AFRICAN AMERICAN: 17 mL/min — AB (ref >60)
Glucose, Bld: 72 mg/dL (ref 65–99)
PHOSPHORUS: 15.4 mg/dL — AB (ref 2.5–4.6)
Potassium: 3.9 mmol/L (ref 3.5–5.1)
SODIUM: 140 mmol/L (ref 135–145)

## 2016-05-29 LAB — CBC
HEMATOCRIT: 19.1 % — AB (ref 39.0–52.0)
HEMOGLOBIN: 6.1 g/dL — AB (ref 13.0–17.0)
MCH: 28.4 pg (ref 26.0–34.0)
MCHC: 31.9 g/dL (ref 30.0–36.0)
MCV: 88.8 fL (ref 78.0–100.0)
Platelets: 246 10*3/uL (ref 150–400)
RBC: 2.15 MIL/uL — AB (ref 4.22–5.81)
RDW: 18.4 % — ABNORMAL HIGH (ref 11.5–15.5)
WBC: 5.2 10*3/uL (ref 4.0–10.5)

## 2016-05-29 LAB — PREPARE RBC (CROSSMATCH)

## 2016-05-29 NOTE — Progress Notes (Signed)
Subjective:  Patient seen and evaluated during hemodialysis. He continues to have significant edema. Hemoglobin currently down to 6.1 and patient is receiving blood transfusion with dialysis today.  Objective:  Vital signs in last 24 hours:  Pulse 96 respirations 13 blood pressure 133/78    Physical Exam: General: Chronically ill appearing  HEENT Moist mucous membranes, hearing intact  Neck supple  Pulm/lungs Diminished at bases, normal effort  CVS/Heart S1S2 no rubs  Abdomen:  Distended, soft  Extremities: Anasarca, 2-3+ pitting edema up to lower abdomen  Neurologic: Alert, able to follow commands  Skin: no acute rashes  Access: Left IJ perm       Basic Metabolic Panel:   Recent Labs Lab 05/23/16 0558 05/24/16 0530 05/25/16 0709 05/27/16 2050 05/29/16 0608  NA 139 142 137 136 140  K 4.7 3.8 4.0 3.9 3.9  CL 110 111 105 104 106  CO2 17* 17* 21* 23 22  GLUCOSE 73 73 80 116* 72  BUN 63* 50* 47* 29* 34*  CREATININE 5.52* 4.47* 4.40* 3.52* 3.95*  CALCIUM 7.5* 6.6* 7.1* 7.5* 7.1*  MG  --  1.5* 1.6*  --   --   PHOS  --  20.0* 5.0* 3.7 15.4*     CBC:  Recent Labs Lab 05/23/16 1845 05/24/16 0530 05/25/16 0709 05/27/16 2050 05/29/16 0608  WBC 6.0 6.6 6.1 5.7 5.2  NEUTROABS  --  4.9  --   --   --   HGB 10.3* 7.4* 7.3* 7.0* 6.1*  HCT 30.6* 22.8* 22.4* 21.7* 19.1*  MCV 86.7 88.0 86.5 88.6 88.8  PLT 334 251 257 239 246      Lab Results  Component Value Date   HEPBSAG Negative 05/22/2016      Microbiology:  Recent Results (from the past 240 hour(s))  Surgical pcr screen     Status: None   Collection Time: 05/23/16  5:35 PM  Result Value Ref Range Status   MRSA, PCR NEGATIVE NEGATIVE Final   Staphylococcus aureus NEGATIVE NEGATIVE Final    Comment:        The Xpert SA Assay (FDA approved for NASAL specimens in patients over 21 years of age), is one component of a comprehensive surveillance program.  Test performance has been validated by  [facility] for patients greater than or equal to 76 year old. It is not intended to diagnose infection nor to guide or monitor treatment.     Coagulation Studies: No results for input(s): LABPROT, INR in the last 72 hours.  Urinalysis: No results for input(s): COLORURINE, LABSPEC, PHURINE, GLUCOSEU, HGBUR, BILIRUBINUR, KETONESUR, PROTEINUR, UROBILINOGEN, NITRITE, LEUKOCYTESUR in the last 72 hours.  Invalid input(s): APPERANCEUR    Imaging: No results found.   Medications:       Assessment/ Plan:  66 y.o.African American male  admitted on 4/21/2018with Endocarditis.  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 Acute renal failure. 2. Chronic kidney disease stage IV with Anasarca Baseline creatinine 4.2 ; GFR of 17 3. Anemia of CKD 4. Acidosis Likely secondary to renal failure 5. Bilateral renal cysts an exophytic left renal mass Evaluated by CT of abdomen without contrast on April 12 Mixed simple, complex proteinaceous/hemorrhagic renal cysts  exophytic focus  In the interpolar region of left kidney.thought to be Ablation site. Other differential includes angiomyolipoma.  Plan:  Patient seen and evaluated during hemodialysis today. He appears to be tolerating well. Ultrafiltration target increased to 3 kg. Hemoglobin was down to 6.1 today and patient is receiving blood transfusion. Continue to monitor CBC closely. Will plan for dialysis again on Friday. Patient most likely has progressed to end-stage renal disease and is unlikely to come off of dialysis. We will continue to monitor his progress.     LOS: 0 Kameelah Minish 5/9/20183:25 PM

## 2016-05-30 LAB — TYPE AND SCREEN
ABO/RH(D): B POS
Antibody Screen: NEGATIVE
Unit division: 0

## 2016-05-30 LAB — BPAM RBC
Blood Product Expiration Date: 201805242359
ISSUE DATE / TIME: 201805091434
UNIT TYPE AND RH: 7300

## 2016-05-31 LAB — BASIC METABOLIC PANEL
Anion gap: 10 (ref 5–15)
BUN: 26 mg/dL — AB (ref 6–20)
CALCIUM: 7.4 mg/dL — AB (ref 8.9–10.3)
CO2: 24 mmol/L (ref 22–32)
CREATININE: 3.32 mg/dL — AB (ref 0.61–1.24)
Chloride: 106 mmol/L (ref 101–111)
GFR calc non Af Amer: 18 mL/min — ABNORMAL LOW (ref >60)
GFR, EST AFRICAN AMERICAN: 21 mL/min — AB (ref >60)
Glucose, Bld: 73 mg/dL (ref 65–99)
Potassium: 4.2 mmol/L (ref 3.5–5.1)
SODIUM: 140 mmol/L (ref 135–145)

## 2016-05-31 LAB — CBC
HCT: 21 % — ABNORMAL LOW (ref 39.0–52.0)
Hemoglobin: 7 g/dL — ABNORMAL LOW (ref 13.0–17.0)
MCH: 29.7 pg (ref 26.0–34.0)
MCHC: 33.3 g/dL (ref 30.0–36.0)
MCV: 89 fL (ref 78.0–100.0)
PLATELETS: 268 10*3/uL (ref 150–400)
RBC: 2.36 MIL/uL — ABNORMAL LOW (ref 4.22–5.81)
RDW: 18.8 % — AB (ref 11.5–15.5)
WBC: 5.6 10*3/uL (ref 4.0–10.5)

## 2016-05-31 NOTE — Progress Notes (Signed)
Subjective:  Patient states that he is feeling somewhat better with the initiation of dialysis. Patient due for dialysis again today. Ultrafiltration target today will be 3 kg.  Objective:  Vital signs in last 24 hours:  temperature 98.3 pulse 77 respirations 30 blood pressure 149/83    Physical Exam: General: Chronically ill appearing  HEENT Moist mucous membranes, hearing intact  Neck supple  Pulm/lungs Diminished at bases, normal effort  CVS/Heart S1S2 no rubs  Abdomen:  Distended, soft  Extremities: Anasarca, 2+ edema in all extremeties  Neurologic: Alert, able to follow commands  Skin: no acute rashes  Access: Left IJ perm       Basic Metabolic Panel:   Recent Labs Lab 05/25/16 0709 05/27/16 2050 05/29/16 0608 05/31/16 0640  NA 137 136 140 140  K 4.0 3.9 3.9 4.2  CL 105 104 106 106  CO2 21* 23 22 24   GLUCOSE 80 116* 72 73  BUN 47* 29* 34* 26*  CREATININE 4.40* 3.52* 3.95* 3.32*  CALCIUM 7.1* 7.5* 7.1* 7.4*  MG 1.6*  --   --   --   PHOS 5.0* 3.7 15.4*  --      CBC:  Recent Labs Lab 05/25/16 0709 05/27/16 2050 05/29/16 0608 05/31/16 0640  WBC 6.1 5.7 5.2 5.6  HGB 7.3* 7.0* 6.1* 7.0*  HCT 22.4* 21.7* 19.1* 21.0*  MCV 86.5 88.6 88.8 89.0  PLT 257 239 246 268      Lab Results  Component Value Date   HEPBSAG Negative 05/22/2016      Microbiology:  Recent Results (from the past 240 hour(s))  Surgical pcr screen     Status: None   Collection Time: 05/23/16  5:35 PM  Result Value Ref Range Status   MRSA, PCR NEGATIVE NEGATIVE Final   Staphylococcus aureus NEGATIVE NEGATIVE Final    Comment:        The Xpert SA Assay (FDA approved for NASAL specimens in patients over 13 years of age), is one component of a comprehensive surveillance program.  Test performance has been validated by [facility] for patients greater than or equal to 53 year old. It is not intended to diagnose infection nor to guide or monitor treatment.      Coagulation Studies: No results for input(s): LABPROT, INR in the last 72 hours.  Urinalysis: No results for input(s): COLORURINE, LABSPEC, PHURINE, GLUCOSEU, HGBUR, BILIRUBINUR, KETONESUR, PROTEINUR, UROBILINOGEN, NITRITE, LEUKOCYTESUR in the last 72 hours.  Invalid input(s): APPERANCEUR    Imaging: No results found.   Medications:       Assessment/ Plan:  66 y.o.African American male  admitted on 4/21/2018with Endocarditis.  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 Acute renal failure. 2. Chronic kidney disease stage IV with Anasarca Baseline creatinine 4.2 ; GFR of 17 3. Anemia of CKD; hemoglobin 7.0. 4. Acidosis Likely secondary to renal failure 5. Bilateral renal cysts an exophytic left renal mass Evaluated by CT of abdomen without contrast on April 12 Mixed simple, complex proteinaceous/hemorrhagic renal cysts  exophytic focus In the interpolar region of left kidney.thought to be Ablation site. Other differential includes angiomyolipoma.  Plan:  The patient has benefited from the initiation of hemodialysis. We will plan for additional  dialysis treatment today with ultrafiltration target of 3 kg. We will plan for dialysis again on Monday as well. The patient is likely dialysis dependent at this point in time.He is from [place].Likely need to consider placing him in an outpatient [place] near his home.  emoglobin still low at 7.0. Recommend and consideration of blood transfusion today but defer this to hospitalist.Patient did receive blood transfusion with her last dialysis treatment. We will continue to monitor the patient's status along with  you.     LOS: 0 Russell [name] 5/11/20188:29 AM

## 2016-06-01 ENCOUNTER — Encounter (HOSPITAL_BASED_OUTPATIENT_CLINIC_OR_DEPARTMENT_OTHER): Payer: Medicare Other

## 2016-06-01 DIAGNOSIS — Z01810 Encounter for preprocedural cardiovascular examination: Secondary | ICD-10-CM

## 2016-06-01 NOTE — Progress Notes (Signed)
Right  Upper Extremity Vein Map    Cephalic  Segment Diameter Depth Comment  1. Axilla 1.61mm 1.27mm   2. Mid upper arm 2.72mm 66mm branch  3. Above AC 29mm 50mm   4. In [facility] 2.51mm 63mm   5. Below AC 1.48mm 75mm   6. Mid forearm 1.58mm 2.86mm   7. Wrist 1.24mm 2.105mm    mm mm    mm mm    mm mm    Basilic  Segment Diameter Depth Comment  1. Axilla 60mm 37mm   2. Mid upper arm 1.29mm 9.4mm Multiple branches  3. Above AC 3.75mm 73mm   4. In [facility] 2.45mm 9.59mm   5. Below AC 2.57mm 43mm   6. Mid forearm 1.24mm 5.53mm   7. Wrist mm mm    mm mm    mm mm    mm mm   Left Upper Extremity Vein Map    Cephalic  Segment Diameter Depth Comment  1. Axilla 1.66mm 1.33mm   2. Mid upper arm 2.32mm 2.41mm   3. Above AC 2.69mm 2.33mm   4. In [facility] 2.73mm 1.97mm   5. Below AC 2.81mm 2.43mm branch  6. Mid forearm 1.29mm 3.35mm   7. Wrist mm mm    mm mm    mm mm    mm mm    Basilic  Segment Diameter Depth Comment  1. Axilla mm mm   2. Mid upper arm/Origin 3.66mm 9mm   3. Above AC 2.58mm 84mm   4. In AC 2.46mm 4.64mm   5. Below AC 1.54mm 5.9mm   6. Mid forearm mm mm Cannot visualize  7. Wrist mm mm Cannot visualize   mm mm    mm mm    mm mm

## 2016-06-03 LAB — PREPARE RBC (CROSSMATCH)

## 2016-06-03 LAB — RENAL FUNCTION PANEL
ALBUMIN: 1.4 g/dL — AB (ref 3.5–5.0)
ANION GAP: 11 (ref 5–15)
BUN: 32 mg/dL — AB (ref 6–20)
CHLORIDE: 103 mmol/L (ref 101–111)
CO2: 23 mmol/L (ref 22–32)
Calcium: 7.7 mg/dL — ABNORMAL LOW (ref 8.9–10.3)
Creatinine, Ser: 3.96 mg/dL — ABNORMAL HIGH (ref 0.61–1.24)
GFR calc Af Amer: 17 mL/min — ABNORMAL LOW (ref >60)
GFR, EST NON AFRICAN AMERICAN: 15 mL/min — AB (ref >60)
Glucose, Bld: 76 mg/dL (ref 65–99)
PHOSPHORUS: 4.5 mg/dL (ref 2.5–4.6)
POTASSIUM: 4.5 mmol/L (ref 3.5–5.1)
Sodium: 137 mmol/L (ref 135–145)

## 2016-06-03 LAB — CBC
HEMATOCRIT: 20.1 % — AB (ref 39.0–52.0)
Hemoglobin: 6.7 g/dL — CL (ref 13.0–17.0)
MCH: 29.9 pg (ref 26.0–34.0)
MCHC: 33.3 g/dL (ref 30.0–36.0)
MCV: 89.7 fL (ref 78.0–100.0)
PLATELETS: 339 10*3/uL (ref 150–400)
RBC: 2.24 MIL/uL — ABNORMAL LOW (ref 4.22–5.81)
RDW: 18.5 % — AB (ref 11.5–15.5)
WBC: 5 10*3/uL (ref 4.0–10.5)

## 2016-06-03 NOTE — Progress Notes (Signed)
Subjective:  Patient states that he is feeling somewhat better with the initiation of dialysis. Patient due for dialysis again today. Ultrafiltration target today will be 2-3 kg. sd tolerated  Objective:  Vital signs in last 24 hours:  temperature 98.7, pulse 90, respirations 16, blood pressure 144/83   Physical Exam: General: Chronically ill appearing  HEENT Moist mucous membranes, hearing intact  Neck supple  Pulm/lungs Diminished at bases, normal effort  CVS/Heart S1S2 no rubs  Abdomen:  Distended, soft  Extremities: Anasarca, 2+ edema in all extremeties  Neurologic: Alert, able to follow commands  Skin: no acute rashes  Access: Left IJ perm       Basic Metabolic Panel:   Recent Labs Lab 05/27/16 2050 05/29/16 0608 05/31/16 0640 06/03/16 0711  NA 136 140 140 137  K 3.9 3.9 4.2 4.5  CL 104 106 106 103  CO2 23 22 24 23   GLUCOSE 116* 72 73 76  BUN 29* 34* 26* 32*  CREATININE 3.52* 3.95* 3.32* 3.96*  CALCIUM 7.5* 7.1* 7.4* 7.7*  PHOS 3.7 15.4*  --  4.5     CBC:  Recent Labs Lab 05/27/16 2050 05/29/16 0608 05/31/16 0640 06/03/16 0710  WBC 5.7 5.2 5.6 5.0  HGB 7.0* 6.1* 7.0* 6.7*  HCT 21.7* 19.1* 21.0* 20.1*  MCV 88.6 88.8 89.0 89.7  PLT 239 246 268 339      Lab Results  Component Value Date   HEPBSAG Negative 05/22/2016      Microbiology:  No results found for this or any previous visit (from the past 240 hour(s)).  Coagulation Studies: No results for input(s): LABPROT, INR in the last 72 hours.  Urinalysis: No results for input(s): COLORURINE, LABSPEC, PHURINE, GLUCOSEU, HGBUR, BILIRUBINUR, KETONESUR, PROTEINUR, UROBILINOGEN, NITRITE, LEUKOCYTESUR in the last 72 hours.  Invalid input(s): APPERANCEUR    Imaging: No results found.   Medications:   Results for MITSUO, BUDNICK (MRN [id number]) as of 06/03/2016 13:20  Ref. Range 05/22/2016 10:08  Hepatitis B Surface Ag Latest Ref Range: Negative  Negative  Hep B Core Ab, Tot Latest Ref  Range: Negative  Positive (A)  Hepatitis B-Post Latest Ref Range: Immunity>9.9 mIU/mL <3.1 (L)      Assessment/ Plan:  66 y.o.African American male  admitted on 4/21/2018with Endocarditis.  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 Acute renal failure, likely progressed to ESRD 2. Chronic kidney disease stage IV with Anasarca Baseline creatinine 4.2 ; GFR of 17 3. Anemia of CKD; hemoglobin 6.7. 4. Anasarca 5. Bilateral renal cysts an exophytic left renal mass Evaluated by CT of abdomen without contrast on April 12 Mixed simple, complex proteinaceous/hemorrhagic renal cysts  exophytic focus In the interpolar region of left kidney.thought to be Ablation site. Other differential includes angiomyolipoma.  Plan:  The patient has benefited from the initiation of hemodialysis. We will plan for additional dialysis treatment today with ultrafiltration target of 3 kg.   The patient is likely dialysis dependent at this point in time. He is from [place]. Likely need to consider placing him in an outpatient [place] near his home.    Hemoglobin still low at 6.7. Recommend and consideration of blood transfusion today but defer this to hospitalist. We  will continue to monitor the patient's status along with you. Not on ESA due to unclear nature of renal mass      LOS: 0 Alycia Cooperwood 5/14/20181:19 PM

## 2016-06-03 NOTE — Consult Note (Signed)
Left basilic vein fistula 8/27  We will preop and consent.  Please do dialysis early Wednesday or Tuesday. OR case will be around noon on Wednesday  Disa Riedlinger, MD [facility] [place] Office: [phone] Pager: [phone]

## 2016-06-04 LAB — TYPE AND SCREEN
ABO/RH(D): B POS
Antibody Screen: NEGATIVE
UNIT DIVISION: 0
UNIT DIVISION: 0

## 2016-06-04 LAB — CBC
HEMATOCRIT: 25.7 % — AB (ref 39.0–52.0)
HEMOGLOBIN: 8.4 g/dL — AB (ref 13.0–17.0)
MCH: 28.5 pg (ref 26.0–34.0)
MCHC: 32.7 g/dL (ref 30.0–36.0)
MCV: 87.1 fL (ref 78.0–100.0)
Platelets: 329 10*3/uL (ref 150–400)
RBC: 2.95 MIL/uL — ABNORMAL LOW (ref 4.22–5.81)
RDW: 17.6 % — ABNORMAL HIGH (ref 11.5–15.5)
WBC: 5.7 10*3/uL (ref 4.0–10.5)

## 2016-06-04 LAB — BPAM RBC
BLOOD PRODUCT EXPIRATION DATE: 201805242359
Blood Product Expiration Date: 201805242359
ISSUE DATE / TIME: 201805141311
ISSUE DATE / TIME: 201805141459
UNIT TYPE AND RH: 7300
Unit Type and Rh: 7300

## 2016-06-05 ENCOUNTER — Encounter (HOSPITAL_COMMUNITY): Payer: Self-pay | Admitting: *Deleted

## 2016-06-05 ENCOUNTER — Encounter (HOSPITAL_COMMUNITY): Payer: Medicare Other | Admitting: Certified Registered Nurse Anesthetist

## 2016-06-05 ENCOUNTER — Inpatient Hospital Stay
Admission: AD | Admit: 2016-06-05 | Discharge: 2016-06-17 | Disposition: A | Discharge status: Skilled Nursing Facility | Payer: Self-pay | Source: Ambulatory Visit | Attending: Internal Medicine | Admitting: Internal Medicine

## 2016-06-05 ENCOUNTER — Ambulatory Visit (HOSPITAL_COMMUNITY): Admission: RE | Admit: 2016-06-05 | Payer: Medicare Other | Source: Ambulatory Visit | Admitting: Vascular Surgery

## 2016-06-05 ENCOUNTER — Encounter (HOSPITAL_COMMUNITY)
Admission: RE | Disposition: A | Discharge status: Skilled Nursing Facility | Payer: Self-pay | Attending: Internal Medicine

## 2016-06-05 DIAGNOSIS — N185 Chronic kidney disease, stage 5: Secondary | ICD-10-CM | POA: Diagnosis not present

## 2016-06-05 DIAGNOSIS — N2889 Other specified disorders of kidney and ureter: Secondary | ICD-10-CM

## 2016-06-05 HISTORY — PX: BASCILIC VEIN TRANSPOSITION: SHX5742

## 2016-06-05 LAB — RENAL FUNCTION PANEL
ALBUMIN: 1.4 g/dL — AB (ref 3.5–5.0)
ANION GAP: 8 (ref 5–15)
BUN: 23 mg/dL — AB (ref 6–20)
CALCIUM: 8 mg/dL — AB (ref 8.9–10.3)
CO2: 22 mmol/L (ref 22–32)
CREATININE: 3.43 mg/dL — AB (ref 0.61–1.24)
Chloride: 104 mmol/L (ref 101–111)
GFR calc Af Amer: 20 mL/min — ABNORMAL LOW (ref >60)
GFR calc non Af Amer: 18 mL/min — ABNORMAL LOW (ref >60)
GLUCOSE: 77 mg/dL (ref 65–99)
PHOSPHORUS: 3.8 mg/dL (ref 2.5–4.6)
Potassium: 4.3 mmol/L (ref 3.5–5.1)
SODIUM: 134 mmol/L — AB (ref 135–145)

## 2016-06-05 LAB — CBC
HCT: 24.6 % — ABNORMAL LOW (ref 39.0–52.0)
HEMOGLOBIN: 7.9 g/dL — AB (ref 13.0–17.0)
MCH: 28.3 pg (ref 26.0–34.0)
MCHC: 32.1 g/dL (ref 30.0–36.0)
MCV: 88.2 fL (ref 78.0–100.0)
PLATELETS: 345 10*3/uL (ref 150–400)
RBC: 2.79 MIL/uL — ABNORMAL LOW (ref 4.22–5.81)
RDW: 17.7 % — ABNORMAL HIGH (ref 11.5–15.5)
WBC: 5.6 10*3/uL (ref 4.0–10.5)

## 2016-06-05 SURGERY — TRANSPOSITION, VEIN, BASILIC
Anesthesia: General | Site: Arm Upper | Laterality: Left

## 2016-06-05 MED ORDER — PHENYLEPHRINE HCL 10 MG/ML IJ SOLN
INTRAVENOUS | Status: DC | PRN
  Administered 2016-06-05: 20 ug/min via INTRAVENOUS

## 2016-06-05 MED ORDER — FENTANYL CITRATE (PF) 100 MCG/2ML IJ SOLN: 25.0000 ug | INTRAMUSCULAR | Status: DC | PRN

## 2016-06-05 MED ORDER — PROMETHAZINE HCL 25 MG/ML IJ SOLN
6.2500 mg | INTRAMUSCULAR | Status: DC | PRN
Start: 2016-06-05 — End: 2016-06-05

## 2016-06-05 MED ORDER — PROPOFOL 10 MG/ML IV BOLUS
INTRAVENOUS | Status: DC | PRN
  Administered 2016-06-05: 150 mg via INTRAVENOUS

## 2016-06-05 MED ORDER — ONDANSETRON HCL 4 MG/2ML IJ SOLN
INTRAMUSCULAR | Status: DC | PRN
  Administered 2016-06-05: 4 mg via INTRAVENOUS

## 2016-06-05 MED ORDER — LIDOCAINE HCL (CARDIAC) 20 MG/ML IV SOLN
INTRAVENOUS | Status: DC | PRN
Start: 2016-06-05 — End: 2016-06-05
  Administered 2016-06-05: 60 mg via INTRAVENOUS

## 2016-06-05 MED ORDER — SODIUM CHLORIDE 0.9 % IV SOLN
INTRAVENOUS | Status: DC | PRN
  Administered 2016-06-05: 500 mL

## 2016-06-05 MED ORDER — FENTANYL CITRATE (PF) 100 MCG/2ML IJ SOLN
INTRAMUSCULAR | Status: DC | PRN
Start: 2016-06-05 — End: 2016-06-05
  Administered 2016-06-05 (×4): 25 ug via INTRAVENOUS

## 2016-06-05 MED ORDER — DEXTROSE 5 % IV SOLN
INTRAVENOUS | Status: DC | PRN
  Administered 2016-06-05: 1.5 g via INTRAVENOUS

## 2016-06-05 MED ORDER — HEPARIN SODIUM (PORCINE) 1000 UNIT/ML IJ SOLN
INTRAMUSCULAR | Status: DC | PRN
  Administered 2016-06-05: 3000 [IU] via INTRAVENOUS

## 2016-06-05 MED ORDER — SODIUM CHLORIDE 0.9 % IV SOLN
INTRAVENOUS | Status: DC
  Administered 2016-06-05 (×2): via INTRAVENOUS

## 2016-06-05 MED ORDER — HEMOSTATIC AGENTS (NO CHARGE) OPTIME
TOPICAL | Status: DC | PRN
  Administered 2016-06-05: 1 via TOPICAL

## 2016-06-05 MED ORDER — MIDAZOLAM HCL 5 MG/5ML IJ SOLN
INTRAMUSCULAR | Status: DC | PRN
  Administered 2016-06-05 (×2): 1 mg via INTRAVENOUS

## 2016-06-05 MED ORDER — PROTAMINE SULFATE 10 MG/ML IV SOLN
INTRAVENOUS | Status: DC | PRN
  Administered 2016-06-05: 25 mg via INTRAVENOUS

## 2016-06-05 MED ORDER — 0.9 % SODIUM CHLORIDE (POUR BTL) OPTIME
TOPICAL | Status: DC | PRN
  Administered 2016-06-05: 1000 mL

## 2016-06-05 SURGICAL SUPPLY — 41 items
ARMBAND PINK RESTRICT EXTREMIT (MISCELLANEOUS) ×3 IMPLANT
CANISTER SUCT 3000ML PPV (MISCELLANEOUS) ×3 IMPLANT
CLIP TI MEDIUM 24 (CLIP) ×3 IMPLANT
CLIP TI MEDIUM 6 (CLIP) IMPLANT
CLIP TI WIDE RED SMALL 24 (CLIP) ×3 IMPLANT
CLIP TI WIDE RED SMALL 6 (CLIP) IMPLANT
COVER PROBE W GEL 5X96 (DRAPES) ×6 IMPLANT
DERMABOND ADHESIVE PROPEN (GAUZE/BANDAGES/DRESSINGS) ×2
DERMABOND ADVANCED (GAUZE/BANDAGES/DRESSINGS) ×2
DERMABOND ADVANCED .7 DNX12 (GAUZE/BANDAGES/DRESSINGS) ×1 IMPLANT
DERMABOND ADVANCED .7 DNX6 (GAUZE/BANDAGES/DRESSINGS) ×1 IMPLANT
DRAPE HALF SHEET 40X57 (DRAPES) ×3 IMPLANT
ELECT REM PT RETURN 9FT ADLT (ELECTROSURGICAL) ×3
ELECTRODE REM PT RTRN 9FT ADLT (ELECTROSURGICAL) ×1 IMPLANT
GLOVE BIO SURGEON STRL SZ 6.5 (GLOVE) ×2 IMPLANT
GLOVE BIO SURGEON STRL SZ7 (GLOVE) ×6 IMPLANT
GLOVE BIO SURGEONS STRL SZ 6.5 (GLOVE) ×1
GLOVE BIOGEL PI IND STRL 7.0 (GLOVE) ×1 IMPLANT
GLOVE BIOGEL PI IND STRL 7.5 (GLOVE) ×1 IMPLANT
GLOVE BIOGEL PI INDICATOR 7.0 (GLOVE) ×2
GLOVE BIOGEL PI INDICATOR 7.5 (GLOVE) ×2
GLOVE SURG SS PI 6.5 STRL IVOR (GLOVE) ×3 IMPLANT
GLOVE SURG SS PI 7.0 STRL IVOR (GLOVE) ×3 IMPLANT
GLOVE SURG SS PI 7.5 STRL IVOR (GLOVE) ×3 IMPLANT
GOWN STRL REUS W/ TWL LRG LVL3 (GOWN DISPOSABLE) ×2 IMPLANT
GOWN STRL REUS W/ TWL XL LVL3 (GOWN DISPOSABLE) ×1 IMPLANT
GOWN STRL REUS W/TWL LRG LVL3 (GOWN DISPOSABLE) ×4
GOWN STRL REUS W/TWL XL LVL3 (GOWN DISPOSABLE) ×2
HEMOSTAT SNOW SURGICEL 2X4 (HEMOSTASIS) ×3 IMPLANT
KIT BASIN OR (CUSTOM PROCEDURE TRAY) ×3 IMPLANT
KIT ROOM TURNOVER OR (KITS) ×3 IMPLANT
NS IRRIG 1000ML POUR BTL (IV SOLUTION) ×3 IMPLANT
PACK CV ACCESS (CUSTOM PROCEDURE TRAY) ×3 IMPLANT
PAD ARMBOARD 7.5X6 YLW CONV (MISCELLANEOUS) ×6 IMPLANT
SUT PROLENE 6 0 CC (SUTURE) ×3 IMPLANT
SUT SILK 2 0 SH (SUTURE) IMPLANT
SUT VIC AB 3-0 SH 27 (SUTURE) ×2
SUT VIC AB 3-0 SH 27X BRD (SUTURE) ×1 IMPLANT
SUT VICRYL 4-0 PS2 18IN ABS (SUTURE) ×3 IMPLANT
UNDERPAD 30X30 (UNDERPADS AND DIAPERS) ×3 IMPLANT
WATER STERILE IRR 1000ML POUR (IV SOLUTION) ×3 IMPLANT

## 2016-06-05 NOTE — Anesthesia Postprocedure Evaluation (Signed)
Anesthesia Post Note  Patient: [name]  Procedure(s) Performed: Procedure(s) (LRB): [place]- LEFT ARM 1ST STAGE (Left)  Patient location during evaluation: PACU Anesthesia Type: General Level of consciousness: awake and alert Pain management: pain level controlled Vital Signs Assessment: post-procedure vital signs reviewed and stable Respiratory status: spontaneous breathing, nonlabored ventilation, respiratory function stable and patient connected to nasal cannula oxygen Cardiovascular status: blood pressure returned to baseline and stable Postop Assessment: no signs of nausea or vomiting Anesthetic complications: no       Last Vitals:  Vitals:   06/05/16 1245 06/05/16 1300  BP: (!) 146/80 (!) 154/80  Pulse: 89 87  Resp: 11 10  Temp:      Last Pain:  Vitals:   05/23/16 1435  PainSc: 0-No pain                 Russell [name]

## 2016-06-05 NOTE — Anesthesia Preprocedure Evaluation (Signed)
Anesthesia Evaluation  Patient identified by MRN, date of birth, ID band Patient awake    Reviewed: Allergy & Precautions, NPO status , Patient's Chart, lab work & pertinent test results  Airway Mallampati: II  TM Distance: >3 FB Neck ROM: Full    Dental no notable dental hx.    Pulmonary Current Smoker,    Pulmonary exam normal breath sounds clear to auscultation       Cardiovascular hypertension, Pt. on medications + Peripheral Vascular Disease  Normal cardiovascular exam Rhythm:Regular Rate:Normal     Neuro/Psych negative neurological ROS  negative psych ROS   GI/Hepatic negative GI ROS, (+)     substance abuse  alcohol use, Hepatitis -, C  Endo/Other  negative endocrine ROS  Renal/GU CRFRenal disease  negative genitourinary   Musculoskeletal negative musculoskeletal ROS (+)   Abdominal   Peds negative pediatric ROS (+)  Hematology  (+) anemia , HIV,   Anesthesia Other Findings   Reproductive/Obstetrics negative OB ROS                             Lab Results  Component Value Date   WBC 5.6 06/05/2016   HGB 7.9 (L) 06/05/2016   HCT 24.6 (L) 06/05/2016   MCV 88.2 06/05/2016   PLT 345 06/05/2016   Lab Results  Component Value Date   CREATININE 3.43 (H) 06/05/2016   BUN 23 (H) 06/05/2016   NA 134 (L) 06/05/2016   K 4.3 06/05/2016   CL 104 06/05/2016   CO2 22 06/05/2016    Anesthesia Physical  Anesthesia Plan  ASA: IV  Anesthesia Plan: General   Post-op Pain Management:    Induction: Intravenous  Airway Management Planned: LMA  Additional Equipment:   Intra-op Plan:   Post-operative Plan:   Informed Consent: I have reviewed the patients History and Physical, chart, labs and discussed the procedure including the risks, benefits and alternatives for the proposed anesthesia with the patient or authorized representative who has indicated his/her understanding  and acceptance.   Dental advisory given  Plan Discussed with: CRNA and Surgeon  Anesthesia Plan Comments:         Anesthesia Quick Evaluation

## 2016-06-05 NOTE — Interval H&P Note (Signed)
History and Physical Interval Note:  06/05/2016 8:40 AM  Russell [name]  has presented today for surgery, with the diagnosis of Chronic Kidney Disease Stage IV  The various methods of treatment have been discussed with the patient and family. After consideration of risks, benefits and other options for treatment, the patient has consented to  Procedure(s): [place] (Left) as a surgical intervention .  The patient's history has been reviewed, patient examined, no change in status, stable for surgery.  I have reviewed the patient's chart and labs.  Questions were answered to the patient's satisfaction.     Annamarie Major  Discussed details of a left staged BVT.  Risks and benefits discussed including non-maturity, steal, and 2nd stage procedure.   [employer]

## 2016-06-05 NOTE — H&P (View-Only) (Signed)
Permanent access when deemed necessary by Dr Holley Raring. Otherwise will sign off at this point  Ruta Hinds, MD [facility] [place] Office: [phone] Pager: [phone]

## 2016-06-05 NOTE — Progress Notes (Signed)
Subjective:  Patient states that he is feeling somewhat better with the initiation of dialysis. Patient due for dialysis again today. Ultrafiltration target today will be 2-3 kg. As tolerated Underwent surgery for AVF placement  Objective:  Vital signs in last 24 hours:  temperature 98.8, pulse 92, respirations 18, blood pressure 111/70   Physical Exam: General: Chronically ill appearing  HEENT Moist mucous membranes, hearing intact  Neck supple  Pulm/lungs Diminished at bases, normal effort  CVS/Heart S1S2 no rubs  Abdomen:  Distended, soft  Extremities: Anasarca, 2+ edema in all extremeties  Neurologic: Alert, able to follow commands  Skin: Rash over moist area under abdominal pannus  Access: Left IJ perm   Developing left arm AVF    Basic Metabolic Panel:   Recent Labs Lab 05/31/16 0640 06/03/16 0711 06/05/16 0545  NA 140 137 134*  K 4.2 4.5 4.3  CL 106 103 104  CO2 24 23 22   GLUCOSE 73 76 77  BUN 26* 32* 23*  CREATININE 3.32* 3.96* 3.43*  CALCIUM 7.4* 7.7* 8.0*  PHOS  --  4.5 3.8     CBC:  Recent Labs Lab 05/31/16 0640 06/03/16 0710 06/04/16 0703 06/05/16 0545  WBC 5.6 5.0 5.7 5.6  HGB 7.0* 6.7* 8.4* 7.9*  HCT 21.0* 20.1* 25.7* 24.6*  MCV 89.0 89.7 87.1 88.2  PLT 268 339 329 345      Lab Results  Component Value Date   HEPBSAG Negative 05/22/2016      Microbiology:  No results found for this or any previous visit (from the past 240 hour(s)).  Coagulation Studies: No results for input(s): LABPROT, INR in the last 72 hours.  Urinalysis: No results for input(s): COLORURINE, LABSPEC, PHURINE, GLUCOSEU, HGBUR, BILIRUBINUR, KETONESUR, PROTEINUR, UROBILINOGEN, NITRITE, LEUKOCYTESUR in the last 72 hours.  Invalid input(s): APPERANCEUR    Imaging: No results found.   Medications:   Results for Russell, [name] (MRN [id number]) as of 06/03/2016 13:20  Ref. Range 05/22/2016 10:08  Hepatitis B Surface Ag Latest Ref Range: Negative   Negative  Hep B Core Ab, Tot Latest Ref Range: Negative  Positive (A)  Hepatitis B-Post Latest Ref Range: Immunity>9.9 mIU/mL <3.1 (L)      Assessment/ Plan:  66 y.o.African American male  admitted on 4/21/2018with Endocarditis.  Patient has HIV for more than 20 years, he has been compliant with his medications, hepatitis B exposure, hepatitis C, coronary disease, chronic back pain, stage IV chronic kidney disease, history of polysubstance abuse in the past, bilateral complex cysts with left renal mass, abdominal aortic aneurysm repair, hernia repair, endocarditis diagnosed in April [address] [place] (Left) 06/05/16  1. Acute renal failure, likely progressed to ESRD 2. Anasarca 3. Anemia of CKD; hemoglobin 7.9 4. HTN 5. Bilateral renal cysts an exophytic left renal mass Evaluated by CT of abdomen without contrast on April 12 Mixed simple, complex proteinaceous/hemorrhagic renal cysts  exophytic focus In the interpolar region of left kidney.thought to be Ablation site. Other differential includes angiomyolipoma.  Plan:    The patient is likely dialysis dependent at this point in time. He is from [place]. Likely need to consider placing him in an outpatient [place] near his home.   Continued on HD three times per week on MWF schedule Recommend and consideration of blood transfusion for Hgb < 7. We will continue to monitor the patient's status along with you. Not on ESA due to unclear nature of renal mass Fluid removal with HD  for anasarca as tolerated - decrease the dose of hydralazine as BP is staying low normal      LOS: 0 Russell [name] 5/16/20185:08 PM

## 2016-06-05 NOTE — Transfer of Care (Signed)
Immediate Anesthesia Transfer of Care Note  Patient: Russell [name]  Procedure(s) Performed: Procedure(s): [place]- LEFT ARM 1ST STAGE (Left)  Patient Location: PACU  Anesthesia Type:General  Level of Consciousness: awake, alert , oriented and patient cooperative  Airway & Oxygen Therapy: Patient Spontanous Breathing and Patient connected to nasal cannula oxygen  Post-op Assessment: Report given to RN and Post -op Vital signs reviewed and stable  Post vital signs: Reviewed and stable  Last Vitals:  Vitals:   05/23/16 1435 05/23/16 1445  BP: (!) 152/87   Pulse: 89 90  Resp: 13 15  Temp:  36.1 C    Last Pain:  Vitals:   05/23/16 1435  PainSc: 0-No pain         Complications: No apparent anesthesia complications

## 2016-06-05 NOTE — Anesthesia Procedure Notes (Signed)
Procedure Name: LMA Insertion Date/Time: 06/05/2016 10:50 AM Performed by: Valda Favia Pre-anesthesia Checklist: Patient identified, Emergency Drugs available, Suction available, Patient being monitored and Timeout performed Patient Re-evaluated:Patient Re-evaluated prior to inductionOxygen Delivery Method: Circle system utilized Preoxygenation: Pre-oxygenation with 100% oxygen Intubation Type: IV induction LMA: LMA inserted LMA Size: 4.0 Number of attempts: 1 Placement Confirmation: positive ETCO2 and breath sounds checked- equal and bilateral Tube secured with: Tape Dental Injury: Teeth and Oropharynx as per pre-operative assessment

## 2016-06-06 ENCOUNTER — Telehealth: Payer: Self-pay | Admitting: Surgery

## 2016-06-06 ENCOUNTER — Encounter (HOSPITAL_COMMUNITY): Payer: Self-pay | Admitting: Surgery

## 2016-06-06 NOTE — Op Note (Signed)
    Patient name: Benjaman Artman MRN: [id number] DOB: 1950-06-18 Sex: male  05/11/2016 - 06/05/2016 Pre-operative Diagnosis: ESRD Post-operative diagnosis:  Same Surgeon:  Annamarie Major Assistants:  Silva Bandy Procedure:   Left 1st stage basilic vein fistula Anesthesia:  General Blood Loss:  See anesthesia record Specimens:  none  Findings:  56mm vein, high take off of radial artery.  Anastamosis is to brachial artery  Indications:  66 yo here for fistula creation  Procedure:  The patient was identified in the holding area and taken to [place] 11  The patient was then placed supine on the table. general anesthesia was administered.  The patient was prepped and draped in the usual sterile fashion.  A time out was called and antibiotics were administered.  Ultrasound was used to evaluate the veins in the arm.  The basilic vein appeared to be adequate.  The other veins do not appear adequate for dialysis.  An oblique incision was made the level of antecubital crease.  I initially dissected out the brachial artery.  I did encounter a radial artery at this level which soon was a high origin.  I then dissected out the basilic vein.  This is approximately a 3 mm vein.  All side branches were ligated between silk ties.  The vein was marked for orientation.  3000 units of heparin was given.  The vein was then ligated distally with a 2-0 silk tie.  The vein distended nicely with heparin saline.  The brachial artery was occluded with vascular clamps and #11 blade was used to make an arteriotomy which was extended longitudinally with Potts scissors.  The vein was then spatulated and a running end-to-side anastomosis was created with 6-0 Prolene.  Prior to completion the appropriate flushing maneuvers were performed the anastomosis was completed.  The patient continued to have excellent Doppler signals in his radial and ulnar artery at the wrist.  There was an excellent thrill within the fistula.  25 mg of  protamine was then given.  Once hemostasis was satisfactory, the incision was closed with 2 layers of 3-0 Vicryl followed by Dermabond.  Disposition:  To PACU stable   V. Annamarie Major, M.D. [facility] [place] Office: [phone] Pager:  [phone]

## 2016-06-06 NOTE — Telephone Encounter (Signed)
-----   Message from Mena Goes, RN sent at 06/05/2016  4:38 PM EDT ----- Regarding: 4-6 weeks   ----- Message ----- From: Alvia Grove, PA-C Sent: 06/05/2016  12:10 PM To: Vvs Charge Pool  S/p left 1st stage basilic vein transposition 06/05/16  F/u with [facility] in 4-6 weeks with duplex  Thanks Maudie Mercury

## 2016-06-06 NOTE — Telephone Encounter (Signed)
Sched lab 07/08/16 at 4:00 and MD 07/15/16 at 2:30. Lm on hm# to inform pt of appts.

## 2016-06-07 LAB — CBC
HCT: 23.8 % — ABNORMAL LOW (ref 39.0–52.0)
Hemoglobin: 7.8 g/dL — ABNORMAL LOW (ref 13.0–17.0)
MCH: 29.2 pg (ref 26.0–34.0)
MCHC: 32.8 g/dL (ref 30.0–36.0)
MCV: 89.1 fL (ref 78.0–100.0)
Platelets: 342 10*3/uL (ref 150–400)
RBC: 2.67 MIL/uL — ABNORMAL LOW (ref 4.22–5.81)
RDW: 17.7 % — AB (ref 11.5–15.5)
WBC: 6 10*3/uL (ref 4.0–10.5)

## 2016-06-07 LAB — RENAL FUNCTION PANEL
ALBUMIN: 1.6 g/dL — AB (ref 3.5–5.0)
ANION GAP: 8 (ref 5–15)
BUN: 19 mg/dL (ref 6–20)
CALCIUM: 8 mg/dL — AB (ref 8.9–10.3)
CO2: 24 mmol/L (ref 22–32)
Chloride: 103 mmol/L (ref 101–111)
Creatinine, Ser: 3.26 mg/dL — ABNORMAL HIGH (ref 0.61–1.24)
GFR calc Af Amer: 22 mL/min — ABNORMAL LOW (ref >60)
GFR calc non Af Amer: 19 mL/min — ABNORMAL LOW (ref >60)
GLUCOSE: 96 mg/dL (ref 65–99)
PHOSPHORUS: 3.4 mg/dL (ref 2.5–4.6)
Potassium: 4 mmol/L (ref 3.5–5.1)
SODIUM: 135 mmol/L (ref 135–145)

## 2016-06-07 NOTE — Progress Notes (Signed)
Subjective:  Patient states that he is feels ok this morning. Ate a good breakfast. Appetite is good Patient due for dialysis again today. Ultrafiltration target  As tolerated Underwent surgery for AVF placement. Has some Left arm edema  Objective:  Vital signs in last 24 hours:  temperature  97.9, pulse 101, respirations 18, blood pressure 169/91   Physical Exam: General: Chronically ill appearing  HEENT Moist mucous membranes, hearing intact  Neck supple  Pulm/lungs Diminished at bases, normal effort  CVS/Heart S1S2 no rubs  Abdomen:  Distended, soft  Extremities: Anasarca, 2+ edema in all extremeties  Neurologic: Alert, able to follow commands  Skin: Rash over moist area under abdominal pannus  Access: Left IJ perm   Developing left arm AVF, left arm edema    Basic Metabolic Panel:   Recent Labs Lab 06/03/16 0711 06/05/16 0545 06/07/16 0650  NA 137 134* 135  K 4.5 4.3 4.0  CL 103 104 103  CO2 23 22 24   GLUCOSE 76 77 96  BUN 32* 23* 19  CREATININE 3.96* 3.43* 3.26*  CALCIUM 7.7* 8.0* 8.0*  PHOS 4.5 3.8 3.4     CBC:  Recent Labs Lab 06/03/16 0710 06/04/16 0703 06/05/16 0545 06/07/16 0650  WBC 5.0 5.7 5.6 6.0  HGB 6.7* 8.4* 7.9* 7.8*  HCT 20.1* 25.7* 24.6* 23.8*  MCV 89.7 87.1 88.2 89.1  PLT 339 329 345 342      Lab Results  Component Value Date   HEPBSAG Negative 05/22/2016      Microbiology:  No results found for this or any previous visit (from the past 240 hour(s)).  Coagulation Studies: No results for input(s): LABPROT, INR in the last 72 hours.  Urinalysis: No results for input(s): COLORURINE, LABSPEC, PHURINE, GLUCOSEU, HGBUR, BILIRUBINUR, KETONESUR, PROTEINUR, UROBILINOGEN, NITRITE, LEUKOCYTESUR in the last 72 hours.  Invalid input(s): APPERANCEUR    Imaging: No results found.   Medications:   Results for CAMEREN, EARNEST (MRN [id number]) as of 06/03/2016 13:20  Ref. Range 05/22/2016 10:08  Hepatitis B Surface Ag Latest Ref  Range: Negative  Negative  Hep B Core Ab, Tot Latest Ref Range: Negative  Positive (A)  Hepatitis B-Post Latest Ref Range: Immunity>9.9 mIU/mL <3.1 (L)      Assessment/ Plan:  66 y.o.African American male  admitted on 4/21/2018with Endocarditis.  Patient has HIV for more than 20 years, he has been compliant with his medications, hepatitis B exposure, hepatitis C, coronary disease, chronic back pain, stage IV chronic kidney disease, history of polysubstance abuse in the past, bilateral complex cysts with left renal mass, abdominal aortic aneurysm repair, hernia repair, endocarditis diagnosed in April [address] (Left) 06/05/16  1. ESRD 2. Anasarca 3. Anemia of CKD; 4. HTN 5. Bilateral renal cysts an exophytic left renal mass Evaluated by CT of abdomen without contrast on April 12 Mixed simple, complex proteinaceous/hemorrhagic renal cysts  exophytic focus In the interpolar region of left kidney.thought to be Ablation site. Other differential includes angiomyolipoma.  Plan:    The patient is likely dialysis dependent at this point in time. He is from [place]. Likely need to consider placing him in an outpatient [place] near his home.   Continued on HD three times per week on MWF schedule Recommend and consideration of blood transfusion for Hgb < 7. We will continue to monitor the patient's status along with you. Not on ESA due to unclear nature of renal mass. Might need MRI as outpatient to  clarify Fluid removal with HD for anasarca as tolerated - decrease the dose of hydralazine as BP is staying low normal      LOS: 0 Karim Aiello 5/18/20189:43 AM

## 2016-06-10 LAB — CBC
HEMATOCRIT: 24.2 % — AB (ref 39.0–52.0)
Hemoglobin: 7.8 g/dL — ABNORMAL LOW (ref 13.0–17.0)
MCH: 28.6 pg (ref 26.0–34.0)
MCHC: 32.2 g/dL (ref 30.0–36.0)
MCV: 88.6 fL (ref 78.0–100.0)
Platelets: 409 10*3/uL — ABNORMAL HIGH (ref 150–400)
RBC: 2.73 MIL/uL — ABNORMAL LOW (ref 4.22–5.81)
RDW: 17.6 % — AB (ref 11.5–15.5)
WBC: 7.2 10*3/uL (ref 4.0–10.5)

## 2016-06-10 LAB — RENAL FUNCTION PANEL
ALBUMIN: 1.7 g/dL — AB (ref 3.5–5.0)
Anion gap: 10 (ref 5–15)
BUN: 33 mg/dL — ABNORMAL HIGH (ref 6–20)
CALCIUM: 8.1 mg/dL — AB (ref 8.9–10.3)
CO2: 21 mmol/L — ABNORMAL LOW (ref 22–32)
Chloride: 106 mmol/L (ref 101–111)
Creatinine, Ser: 4.09 mg/dL — ABNORMAL HIGH (ref 0.61–1.24)
GFR calc Af Amer: 16 mL/min — ABNORMAL LOW (ref >60)
GFR calc non Af Amer: 14 mL/min — ABNORMAL LOW (ref >60)
Glucose, Bld: 120 mg/dL — ABNORMAL HIGH (ref 65–99)
PHOSPHORUS: 4 mg/dL (ref 2.5–4.6)
POTASSIUM: 4 mmol/L (ref 3.5–5.1)
Sodium: 137 mmol/L (ref 135–145)

## 2016-06-10 NOTE — Progress Notes (Signed)
  Subjective:   Patient seen during dialysis Tolerating well  Sitting up in recliner chair  Objective:  Vital signs in last 24 hours:  temperature 98.5, pulse 85, respirations 12, blood pressure 132/71, oxygen saturation 95%   Physical Exam: General: Chronically ill appearing  HEENT Moist mucous membranes, hearing intact  Neck supple  Pulm/lungs Diminished at bases, normal effort  CVS/Heart S1S2 no rubs  Abdomen:  Distended, soft  Extremities: 1-2+ edema in legs  Neurologic: Alert, able to follow commands     Access: Left IJ perm   Developing left arm AVF, left arm edema    Basic Metabolic Panel:   Recent Labs Lab 06/05/16 0545 06/07/16 0650 06/10/16 1300  NA 134* 135 137  K 4.3 4.0 4.0  CL 104 103 106  CO2 22 24 21*  GLUCOSE 77 96 120*  BUN 23* 19 33*  CREATININE 3.43* 3.26* 4.09*  CALCIUM 8.0* 8.0* 8.1*  PHOS 3.8 3.4 4.0     CBC:  Recent Labs Lab 06/04/16 0703 06/05/16 0545 06/07/16 0650 06/10/16 1300  WBC 5.7 5.6 6.0 7.2  HGB 8.4* 7.9* 7.8* 7.8*  HCT 25.7* 24.6* 23.8* 24.2*  MCV 87.1 88.2 89.1 88.6  PLT 329 345 342 409*      Lab Results  Component Value Date   HEPBSAG Negative 05/22/2016      Microbiology:  No results found for this or any previous visit (from the past 240 hour(s)).  Coagulation Studies: No results for input(s): LABPROT, INR in the last 72 hours.  Urinalysis: No results for input(s): COLORURINE, LABSPEC, PHURINE, GLUCOSEU, HGBUR, BILIRUBINUR, KETONESUR, PROTEINUR, UROBILINOGEN, NITRITE, LEUKOCYTESUR in the last 72 hours.  Invalid input(s): APPERANCEUR    Imaging: No results found.   Medications:   Results for SABIAN, KUBA (MRN [id number]) as of 06/03/2016 13:20  Ref. Range 05/22/2016 10:08  Hepatitis B Surface Ag Latest Ref Range: Negative  Negative  Hep B Core Ab, Tot Latest Ref Range: Negative  Positive (A)  Hepatitis B-Post Latest Ref Range: Immunity>9.9 mIU/mL <3.1 (L)      Assessment/ Plan:  66  y.o.African American male  admitted on 4/21/2018with Endocarditis.  Patient has HIV for more than 20 years, he has been compliant with his medications, hepatitis B exposure, hepatitis C, coronary disease, chronic back pain, stage IV chronic kidney disease, history of polysubstance abuse in the past, bilateral complex cysts with left renal mass, abdominal aortic aneurysm repair, hernia repair, endocarditis diagnosed in April [address] (Left) 06/05/16  1. ESRD 2. Anasarca 3. Anemia of CKD; 4. HTN 5. Bilateral renal cysts an exophytic left renal mass Evaluated by CT of abdomen without contrast on April 12 Mixed simple, complex proteinaceous/hemorrhagic renal cysts  exophytic focus In the interpolar region of left kidney.thought to be Ablation site. Other differential includes angiomyolipoma.  Plan:   The patient is likely dialysis dependent at this point in time. He is from [place]. Likely need to consider placing him in an outpatient Dialysis Center near his home.   Continued on HD three times per week on MWF schedule Recommend and consideration of blood transfusion for Hgb < 7. We will continue to monitor the patient's status along with you. Not on ESA due to unclear nature of renal mass. Might need MRI as outpatient  Fluid removal with HD for anasarca as tolerated - d/c amlodipine and hydralazine  - start ARB losartan      LOS: 0 Adiana Smelcer 5/21/20182:59 PM

## 2016-06-12 LAB — CBC
HCT: 22.2 % — ABNORMAL LOW (ref 39.0–52.0)
Hemoglobin: 7.1 g/dL — ABNORMAL LOW (ref 13.0–17.0)
MCH: 28.4 pg (ref 26.0–34.0)
MCHC: 32 g/dL (ref 30.0–36.0)
MCV: 88.8 fL (ref 78.0–100.0)
Platelets: 416 10*3/uL — ABNORMAL HIGH (ref 150–400)
RBC: 2.5 MIL/uL — ABNORMAL LOW (ref 4.22–5.81)
RDW: 17.5 % — ABNORMAL HIGH (ref 11.5–15.5)
WBC: 7.3 10*3/uL (ref 4.0–10.5)

## 2016-06-12 LAB — RENAL FUNCTION PANEL
ALBUMIN: 1.8 g/dL — AB (ref 3.5–5.0)
Anion gap: 11 (ref 5–15)
BUN: 27 mg/dL — AB (ref 6–20)
CO2: 23 mmol/L (ref 22–32)
Calcium: 8.6 mg/dL — ABNORMAL LOW (ref 8.9–10.3)
Chloride: 104 mmol/L (ref 101–111)
Creatinine, Ser: 3.77 mg/dL — ABNORMAL HIGH (ref 0.61–1.24)
GFR calc Af Amer: 18 mL/min — ABNORMAL LOW (ref >60)
GFR calc non Af Amer: 16 mL/min — ABNORMAL LOW (ref >60)
GLUCOSE: 85 mg/dL (ref 65–99)
PHOSPHORUS: 3.4 mg/dL (ref 2.5–4.6)
Potassium: 3.8 mmol/L (ref 3.5–5.1)
SODIUM: 138 mmol/L (ref 135–145)

## 2016-06-12 LAB — QUANTIFERON IN TUBE
QUANTIFERON MITOGEN VALUE: 0.76 [IU]/mL
QUANTIFERON TB AG VALUE: 0.03 [IU]/mL
QUANTIFERON TB GOLD: NEGATIVE
Quantiferon Nil Value: 0.05 IU/mL

## 2016-06-12 LAB — ALT: ALT: 12 U/L — ABNORMAL LOW (ref 17–63)

## 2016-06-12 LAB — QUANTIFERON TB GOLD ASSAY (BLOOD)

## 2016-06-12 NOTE — Progress Notes (Signed)
Subjective:   Patient seen towards the end of dialysis treamtent Tolerating well  Sitting up in recliner chair   Objective:  Vital signs in last 24 hours:  temperature 98.1, pulse 87, respirations 10, blood pressure 169/91     Physical Exam: General: Chronically ill appearing  HEENT Moist mucous membranes, hearing intact  Neck supple  Pulm/lungs Diminished at bases, normal effort  CVS/Heart regular  Abdomen:  Distended, soft, + dependent edema  Extremities: 1-2+ edema in legs  Neurologic: Alert, able to follow commands     Access: Left IJ perm   Developing left arm AVF, left arm edema    Basic Metabolic Panel:   Recent Labs Lab 06/07/16 0650 06/10/16 1300 06/12/16 0816  NA 135 137 138  K 4.0 4.0 3.8  CL 103 106 104  CO2 24 21* 23  GLUCOSE 96 120* 85  BUN 19 33* 27*  CREATININE 3.26* 4.09* 3.77*  CALCIUM 8.0* 8.1* 8.6*  PHOS 3.4 4.0 3.4     CBC:  Recent Labs Lab 06/07/16 0650 06/10/16 1300 06/12/16 0816  WBC 6.0 7.2 7.3  HGB 7.8* 7.8* 7.1*  HCT 23.8* 24.2* 22.2*  MCV 89.1 88.6 88.8  PLT 342 409* 416*      Lab Results  Component Value Date   HEPBSAG Negative 05/22/2016      Microbiology:  No results found for this or any previous visit (from the past 240 hour(s)).  Coagulation Studies: No results for input(s): LABPROT, INR in the last 72 hours.  Urinalysis: No results for input(s): COLORURINE, LABSPEC, PHURINE, GLUCOSEU, HGBUR, BILIRUBINUR, KETONESUR, PROTEINUR, UROBILINOGEN, NITRITE, LEUKOCYTESUR in the last 72 hours.  Invalid input(s): APPERANCEUR    Imaging: No results found.   Medications:   Results for GIAVANNI, ODONOVAN (MRN [id number]) as of 06/03/2016 13:20  Ref. Range 05/22/2016 10:08  Hepatitis B Surface Ag Latest Ref Range: Negative  Negative  Hep B Core Ab, Tot Latest Ref Range: Negative  Positive (A)  Hepatitis B-Post Latest Ref Range: Immunity>9.9 mIU/mL <3.1 (L)   Results for RIP, HAWES (MRN [id number]) as of  06/12/2016 16:57  Ref. Range 06/10/2016 16:00  QUANTIFERON TB GOLD Latest Ref Range: Negative  Negative     Assessment/ Plan:  66 y.o.African American male  admitted on 4/21/2018with Endocarditis.  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 ESRD 2. Anasarca 3. Anemia of CKD; 4. HTN 5. Bilateral renal cysts an exophytic left renal mass Evaluated by CT of abdomen without contrast on April 12 Mixed simple, complex proteinaceous/hemorrhagic renal cysts  exophytic focus In the interpolar region of left kidney.thought to be Ablation site. Other differential includes angiomyolipoma.  Plan:   The patient is likely dialysis dependent at this point in time. He is from [place]. Likely need to consider placing him in an outpatient Dialysis Center near his home.   Continued on HD three times per week on MWF schedule Recommend and consideration of blood transfusion for Hgb < 7. We will continue to monitor the patient's status along with you. Not on ESA due to unclear nature of renal mass. Might need MRI as outpatient  Fluid removal with HD for anasarca as  tolerated - d/c amlodipine and hydralazine  - continue ARB losartan      LOS: 0 Russell [name] 5/23/20184:56 PM

## 2016-06-14 ENCOUNTER — Encounter: Payer: Self-pay | Admitting: Radiology

## 2016-06-14 ENCOUNTER — Other Ambulatory Visit (HOSPITAL_COMMUNITY): Payer: Self-pay

## 2016-06-14 LAB — RENAL FUNCTION PANEL
Albumin: 1.9 g/dL — ABNORMAL LOW (ref 3.5–5.0)
Anion gap: 10 (ref 5–15)
BUN: 35 mg/dL — AB (ref 6–20)
CHLORIDE: 104 mmol/L (ref 101–111)
CO2: 24 mmol/L (ref 22–32)
CREATININE: 3.77 mg/dL — AB (ref 0.61–1.24)
Calcium: 8.6 mg/dL — ABNORMAL LOW (ref 8.9–10.3)
GFR calc Af Amer: 18 mL/min — ABNORMAL LOW (ref >60)
GFR, EST NON AFRICAN AMERICAN: 16 mL/min — AB (ref >60)
GLUCOSE: 85 mg/dL (ref 65–99)
POTASSIUM: 3.8 mmol/L (ref 3.5–5.1)
Phosphorus: 3.3 mg/dL (ref 2.5–4.6)
Sodium: 138 mmol/L (ref 135–145)

## 2016-06-14 LAB — CBC
HEMATOCRIT: 19.1 % — AB (ref 39.0–52.0)
Hemoglobin: 6.3 g/dL — CL (ref 13.0–17.0)
MCH: 29.7 pg (ref 26.0–34.0)
MCHC: 33 g/dL (ref 30.0–36.0)
MCV: 90.1 fL (ref 78.0–100.0)
PLATELETS: 369 10*3/uL (ref 150–400)
RBC: 2.12 MIL/uL — ABNORMAL LOW (ref 4.22–5.81)
RDW: 17.9 % — AB (ref 11.5–15.5)
WBC: 7.2 10*3/uL (ref 4.0–10.5)

## 2016-06-14 LAB — PREPARE RBC (CROSSMATCH)

## 2016-06-14 MED ORDER — IOPAMIDOL (ISOVUE-300) INJECTION 61%
100.0000 mL | Freq: Once | INTRAVENOUS | Status: AC | PRN
  Administered 2016-06-14: 100 mL via INTRAVENOUS

## 2016-06-14 NOTE — Progress Notes (Signed)
Subjective:   Patient is dong well. Finished eating breakfast  Sitting up on the side of bed   Objective:  Vital signs in last 24 hours:  temperature 98.1, pulse 84, respirations 18, blood pressure 153/86     Physical Exam: General: Chronically ill appearing  HEENT Moist mucous membranes, hearing intact  Neck supple  Pulm/lungs Diminished at bases, normal effort  CVS/Heart regular  Abdomen:  Distended, soft, + dependent edema  Extremities: 1-2+ edema in legs  Neurologic: Alert, able to follow commands     Access: Left IJ perm   Developing left arm AVF, left arm edema    Basic Metabolic Panel:   Recent Labs Lab 06/10/16 1300 06/12/16 0816 06/14/16 0557  NA 137 138 138  K 4.0 3.8 3.8  CL 106 104 104  CO2 21* 23 24  GLUCOSE 120* 85 85  BUN 33* 27* 35*  CREATININE 4.09* 3.77* 3.77*  CALCIUM 8.1* 8.6* 8.6*  PHOS 4.0 3.4 3.3     CBC:  Recent Labs Lab 06/10/16 1300 06/12/16 0816 06/14/16 0557  WBC 7.2 7.3 7.2  HGB 7.8* 7.1* 6.3*  HCT 24.2* 22.2* 19.1*  MCV 88.6 88.8 90.1  PLT 409* 416* 369      Lab Results  Component Value Date   HEPBSAG Negative 05/22/2016      Microbiology:  No results found for this or any previous visit (from the past 240 hour(s)).  Coagulation Studies: No results for input(s): LABPROT, INR in the last 72 hours.  Urinalysis: No results for input(s): COLORURINE, LABSPEC, PHURINE, GLUCOSEU, HGBUR, BILIRUBINUR, KETONESUR, PROTEINUR, UROBILINOGEN, NITRITE, LEUKOCYTESUR in the last 72 hours.  Invalid input(s): APPERANCEUR    Imaging: No results found.   Medications:   Results for RESHAD, SAAB (MRN [id number])    Ref. Range 05/22/2016 10:08  Hepatitis B Surface Ag Latest Ref Range: Negative  Negative  Hep B Core Ab, Tot Latest Ref Range: Negative  Positive (A)  Hepatitis B-Post Latest Ref Range: Immunity>9.9 mIU/mL <3.1 (L)   Results for DEL, OVERFELT (MRN [id number])    Ref. Range 06/10/2016 16:00  QUANTIFERON TB  GOLD Latest Ref Range: Negative  Negative     Assessment/ Plan:  66 y.o.African American male  admitted on 4/21/2018with Endocarditis.  Patient has HIV for more than 20 years, he has been compliant with his medications, hepatitis B exposure, hepatitis C, coronary disease, chronic back pain, stage IV chronic kidney disease, history of polysubstance abuse in the past, bilateral complex cysts with left renal mass, abdominal aortic aneurysm repair, hernia repair, endocarditis diagnosed in April [address] (Left) 06/05/16  1. ESRD 2. Anasarca 3. Anemia of CKD; 4. HTN 5. Bilateral renal cysts an exophytic left renal mass Evaluated by CT of abdomen without contrast on April 12 Mixed simple, complex proteinaceous/hemorrhagic renal cysts  exophytic focus In the interpolar region of left kidney.thought to be Ablation site. Other differential includes angiomyolipoma.  Plan:  The patient is likely dialysis dependent at this point in time. He is from [place]. Likely need to consider placing him in an outpatient Dialysis Center near his home.   Continued on HD three times per week on MWF schedule Recommend and consideration of blood transfusion for Hgb < 7. We will continue to monitor the patient's status along with you. Not on ESA due to unclear nature of renal mass. Might need MRI as outpatient or CT abdomen with iv contrast- ordered Blood transfusion planned with dialysis. Fluid removal  with HD for anasarca as tolerated- improved significantly - d/c amlodipine and hydralazine  - continue ARB losartan D/c planning is in progress. Quant Gold neg. Hep B sAg neg      LOS: 0 Chariti Havel 5/25/20189:43 AM

## 2016-06-15 LAB — TYPE AND SCREEN
ABO/RH(D): B POS
Antibody Screen: NEGATIVE
UNIT DIVISION: 0

## 2016-06-15 LAB — BPAM RBC
BLOOD PRODUCT EXPIRATION DATE: 201806142359
ISSUE DATE / TIME: 201805251307
Unit Type and Rh: 5100

## 2016-06-17 LAB — RENAL FUNCTION PANEL
ALBUMIN: 2.1 g/dL — AB (ref 3.5–5.0)
Anion gap: 15 (ref 5–15)
BUN: 70 mg/dL — AB (ref 6–20)
CHLORIDE: 99 mmol/L — AB (ref 101–111)
CO2: 19 mmol/L — ABNORMAL LOW (ref 22–32)
CREATININE: 5.48 mg/dL — AB (ref 0.61–1.24)
Calcium: 8.6 mg/dL — ABNORMAL LOW (ref 8.9–10.3)
GFR calc Af Amer: 12 mL/min — ABNORMAL LOW (ref >60)
GFR, EST NON AFRICAN AMERICAN: 10 mL/min — AB (ref >60)
GLUCOSE: 104 mg/dL — AB (ref 65–99)
PHOSPHORUS: 4.5 mg/dL (ref 2.5–4.6)
Potassium: 4.9 mmol/L (ref 3.5–5.1)
Sodium: 133 mmol/L — ABNORMAL LOW (ref 135–145)

## 2016-06-17 LAB — CBC
HCT: 30.2 % — ABNORMAL LOW (ref 39.0–52.0)
Hemoglobin: 10 g/dL — ABNORMAL LOW (ref 13.0–17.0)
MCH: 28.9 pg (ref 26.0–34.0)
MCHC: 33.1 g/dL (ref 30.0–36.0)
MCV: 87.3 fL (ref 78.0–100.0)
PLATELETS: 309 10*3/uL (ref 150–400)
RBC: 3.46 MIL/uL — ABNORMAL LOW (ref 4.22–5.81)
RDW: 18.6 % — AB (ref 11.5–15.5)
WBC: 6.7 10*3/uL (ref 4.0–10.5)

## 2016-06-17 NOTE — Progress Notes (Signed)
Subjective:  Patient doing well at the moment. He is seen and evaluated during dialysis. Overall in good spirits. Volume control much improved since he initiated dialysis.    Objective:  Vital signs in last 24 hours:  emperature 98.5 pulse 86 respirations 20 blood pressure 151/76   Physical Exam: General: Chronically ill appearing  HEENT Moist mucous membranes, hearing intact  Neck supple  Pulm/lungs Diminished at bases, normal effort  CVS/Heart Regular, no rubs  Abdomen:  Distended, soft, + dependent edema  Extremities: No lower extremity edema  Neurologic: Alert, able to follow commands     Access: Left IJ perm   Developing left arm AVF, left arm edema    Basic Metabolic Panel:   Recent Labs Lab 06/10/16 1300 06/12/16 0816 06/14/16 0557 06/17/16 0640  NA 137 138 138 133*  K 4.0 3.8 3.8 4.9  CL 106 104 104 99*  CO2 21* 23 24 19*  GLUCOSE 120* 85 85 104*  BUN 33* 27* 35* 70*  CREATININE 4.09* 3.77* 3.77* 5.48*  CALCIUM 8.1* 8.6* 8.6* 8.6*  PHOS 4.0 3.4 3.3 4.5     CBC:  Recent Labs Lab 06/10/16 1300 06/12/16 0816 06/14/16 0557 06/17/16 0634  WBC 7.2 7.3 7.2 6.7  HGB 7.8* 7.1* 6.3* 10.0*  HCT 24.2* 22.2* 19.1* 30.2*  MCV 88.6 88.8 90.1 87.3  PLT 409* 416* 369 309      Lab Results  Component Value Date   HEPBSAG Negative 05/22/2016      Microbiology:  No results found for this or any previous visit (from the past 240 hour(s)).  Coagulation Studies: No results for input(s): LABPROT, INR in the last 72 hours.  Urinalysis: No results for input(s): COLORURINE, LABSPEC, PHURINE, GLUCOSEU, HGBUR, BILIRUBINUR, KETONESUR, PROTEINUR, UROBILINOGEN, NITRITE, LEUKOCYTESUR in the last 72 hours.  Invalid input(s): APPERANCEUR    Imaging: No results found.   Medications:   Results for HENCE, DERRICK (MRN [id number])    Ref. Range 05/22/2016 10:08  Hepatitis B Surface Ag Latest Ref Range: Negative  Negative  Hep B Core Ab, Tot Latest Ref  Range: Negative  Positive (A)  Hepatitis B-Post Latest Ref Range: Immunity>9.9 mIU/mL <3.1 (L)   Results for MIKIE, MISNER (MRN [id number])    Ref. Range 06/10/2016 16:00  QUANTIFERON TB GOLD Latest Ref Range: Negative  Negative     Assessment/ Plan:  66 y.o.African American male  admitted on 4/21/2018with Endocarditis.  Patient has HIV for more than 20 years, he has been compliant with his medications, hepatitis B exposure, hepatitis C, coronary disease, chronic back pain, stage IV chronic kidney disease, history of polysubstance abuse in the past, bilateral complex cysts with left renal mass, abdominal aortic aneurysm repair, hernia repair, endocarditis diagnosed in April [address] (Left) 06/05/16  1. ESRD 2. Anasarca, generalized edema 3. Anemia of CKD; 4. HTN 5. Bilateral renal cysts an exophytic left renal mass Evaluated by CT of abdomen without contrast on April 12 Mixed simple, complex proteinaceous/hemorrhagic renal cysts  exophytic focus In the interpolar region of left kidney.thought to be Ablation site. Other differential includes angiomyolipoma.  Plan:  Patient remains dialysis dependent at this point in time. He will be dialyzing in [facility].We have avoided Epogen for treatment of his anemia of chronic kidney disease given unclear nature of renal masses. He will need MRI of the left renal mass noted previously. Otherwise recommend continued monitoring of bone metabolism parameters as well as CBC. He has a  fistula that was placed on 06/05/16 but will need additional intervention prior to being used.Patient seen and evaluated during dialysis treatment today and he appears to be tolerating quite well.      LOS: 0 Kanika Bungert 5/28/201811:15 AM

## 2016-06-24 NOTE — Addendum Note (Signed)
Addendum  created 06/24/16 1417 by Myrtie Soman, MD   Sign clinical note

## 2016-07-03 ENCOUNTER — Encounter: Payer: Self-pay | Admitting: Surgery

## 2016-07-04 ENCOUNTER — Other Ambulatory Visit: Payer: Self-pay

## 2016-07-04 DIAGNOSIS — N186 End stage renal disease: Secondary | ICD-10-CM

## 2016-07-04 DIAGNOSIS — Z992 Dependence on renal dialysis: Principal | ICD-10-CM

## 2016-07-08 ENCOUNTER — Encounter (HOSPITAL_COMMUNITY): Payer: Medicare Other

## 2016-07-11 ENCOUNTER — Encounter: Payer: Self-pay | Admitting: Surgery

## 2016-07-15 ENCOUNTER — Encounter: Payer: Medicare Other | Admitting: Surgery

## 2016-07-22 ENCOUNTER — Inpatient Hospital Stay (HOSPITAL_COMMUNITY): Admission: RE | Admit: 2016-07-22 | Payer: Medicare Other | Source: Ambulatory Visit

## 2016-07-22 ENCOUNTER — Encounter: Payer: Medicare Other | Admitting: Surgery

## 2016-08-21 DEATH — deceased

## 2017-04-24 IMAGING — CR DG CHEST 1V PORT
1 series · 1 of 1 positions shown · non-contrast
Comparison: Chest radiograph 05/01/2016

CLINICAL DATA: Patient with history of pneumonia.

EXAM:
PORTABLE CHEST 1 VIEW

[AP]
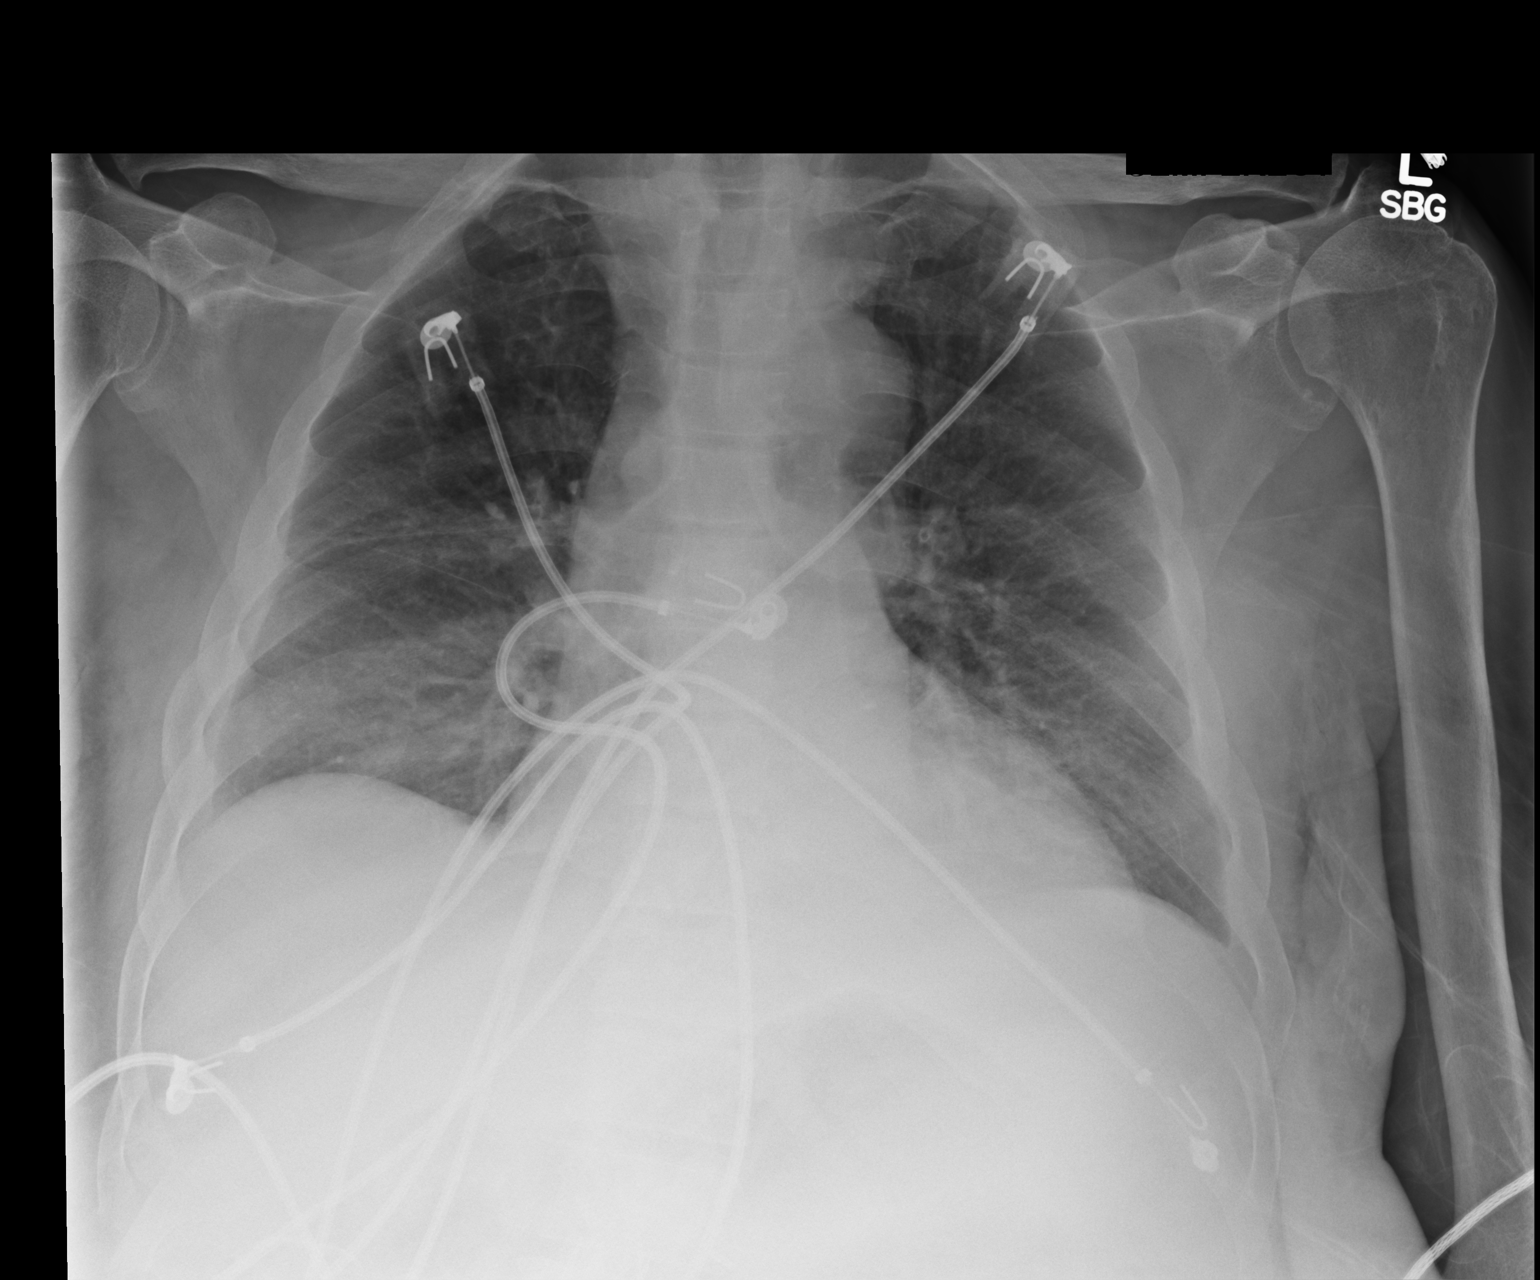

[1 of 1 positions shown; findings below may reference images not displayed]

FINDINGS: Monitoring leads overlie the patient. Stable cardiac and mediastinal
contours. Low lung volumes. Bilateral mid and lower lung
heterogeneous pulmonary opacities. No pleural effusion or
pneumothorax.
IMPRESSION: Mid and lower lung heterogeneous opacities may represent atelectasis
or infection. Followup PA and lateral chest X-ray is recommended in
3-4 weeks following trial of antibiotic therapy to ensure resolution
and exclude underlying malignancy.

## 2017-05-27 IMAGING — CT CT ABD-PELV W/ CM
2 of 5 series · 15 of 46 positions shown, 17 images · IV contrast (APPLIED)
Comparison: CT of the abdomen and pelvis performed 05/02/2016, and
performed 07/09/2011

CLINICAL DATA: Follow-up left renal mass.  Subsequent encounter.

EXAM:
CT ABDOMEN AND PELVIS WITH CONTRAST
TECHNIQUE: Multidetector CT imaging of the abdomen and pelvis was performed
using the standard protocol following bolus administration of
intravenous contrast.
CONTRAST:  100mL K9OE4J-ABB IOPAMIDOL (K9OE4J-ABB) INJECTION 61%

[Series 3: abd/ pelvis 5.0 i30f 2 · axial · 0.73mm/px · z∈[+820,+1170]mm · 12 of 80 slices shown, 14 images]
[im 5/80  soft-tissue]
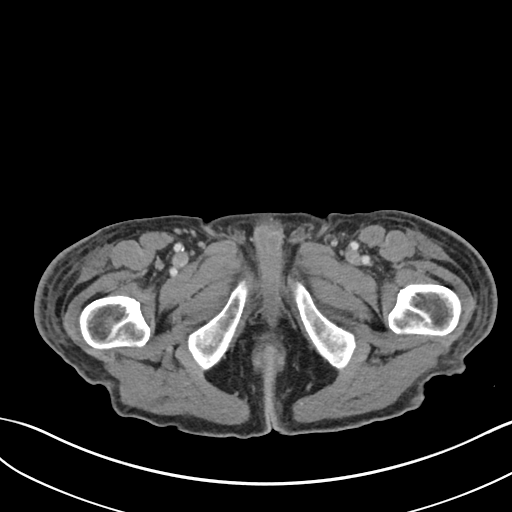
[im 5/80  bone]
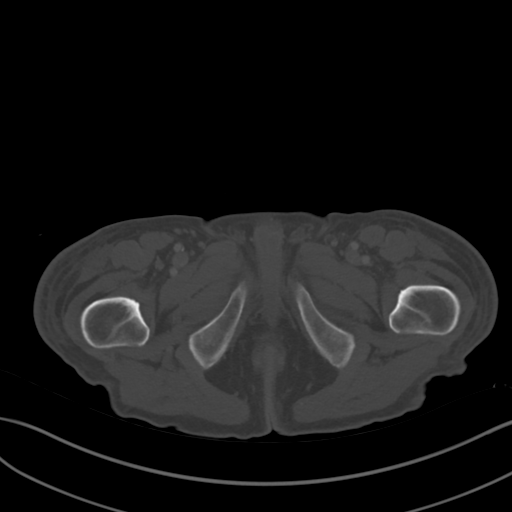
[im 14/80  soft-tissue]
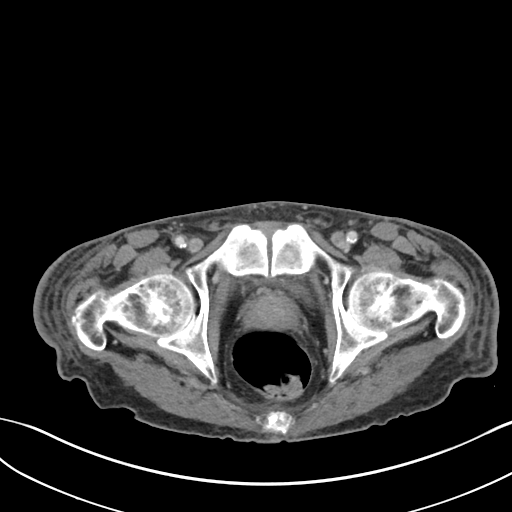
[im 18/80  soft-tissue]
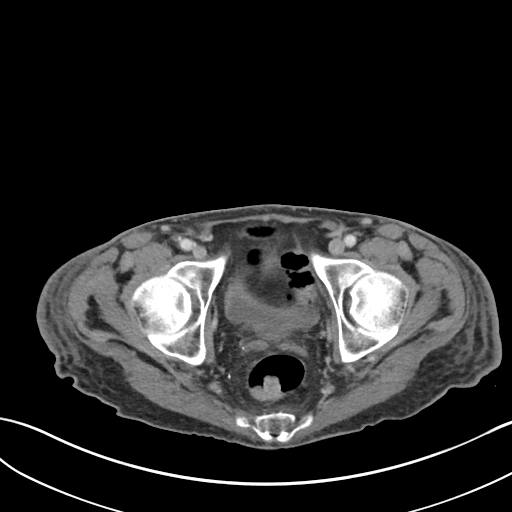
[im 22/80  soft-tissue]
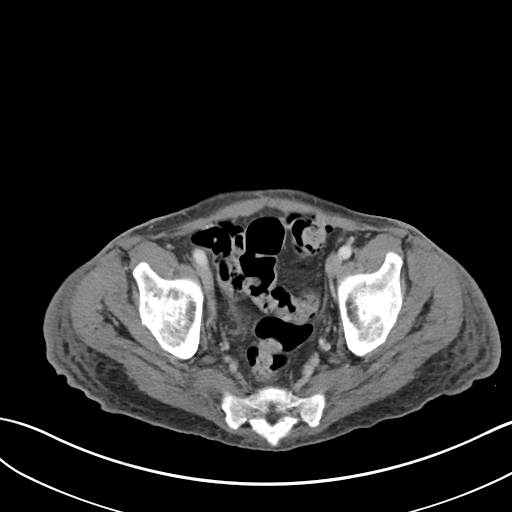
[im 31/80  soft-tissue]
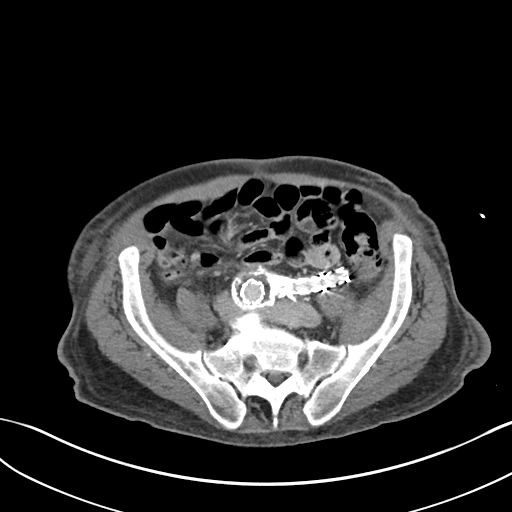
[im 36/80  soft-tissue]
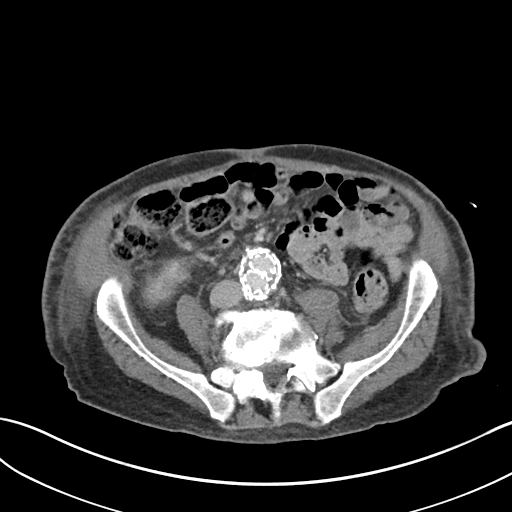
[im 44/80  soft-tissue]
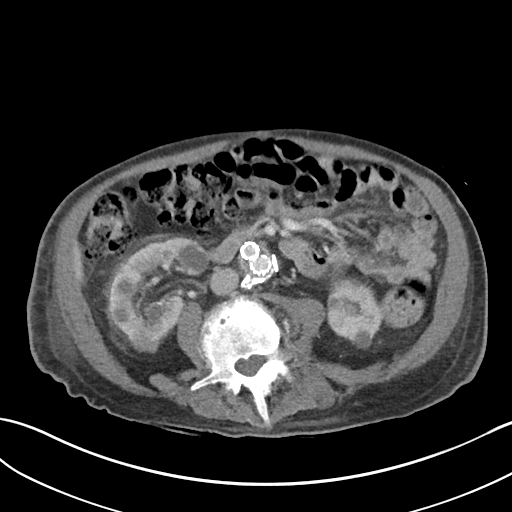
[im 49/80  soft-tissue]
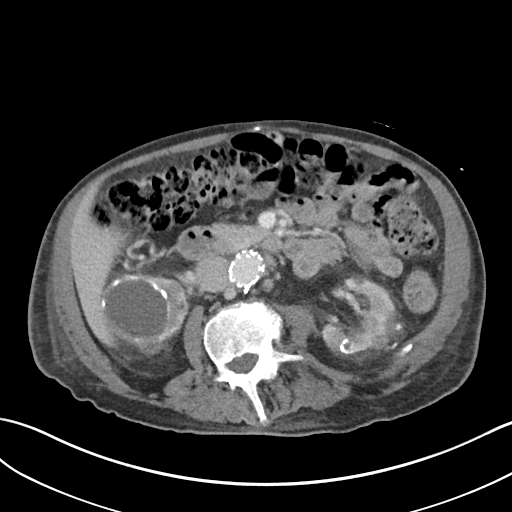
[im 58/80  soft-tissue]
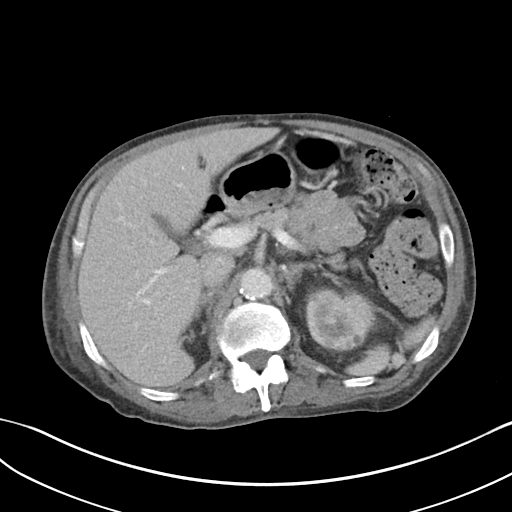
[im 58/80  bone]
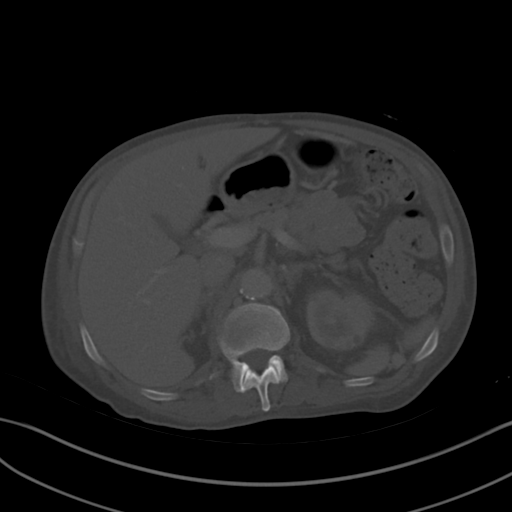
[im 62/80  soft-tissue]
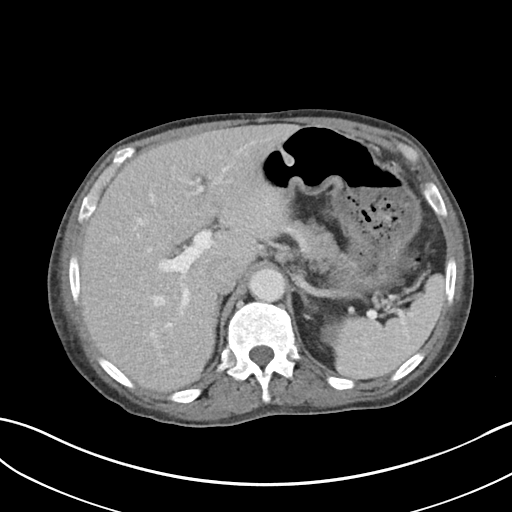
[im 66/80  soft-tissue]
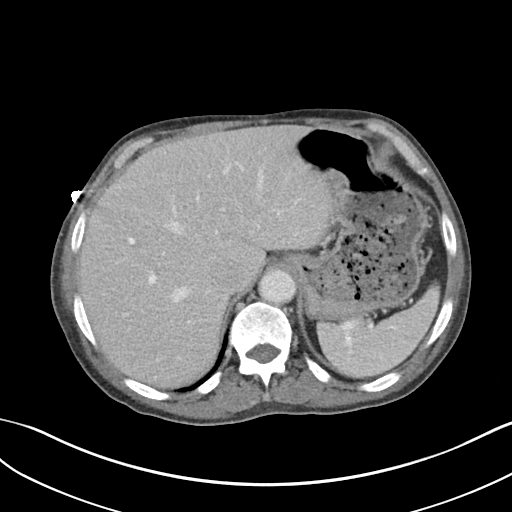
[im 75/80  soft-tissue]
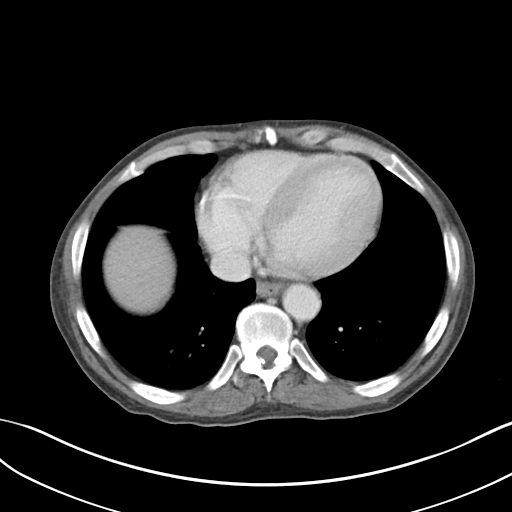

[Series 6: coronal soft tissue · coronal · 0.63mm/px · 3 of 75 slices shown]
[im 25/75  soft-tissue]
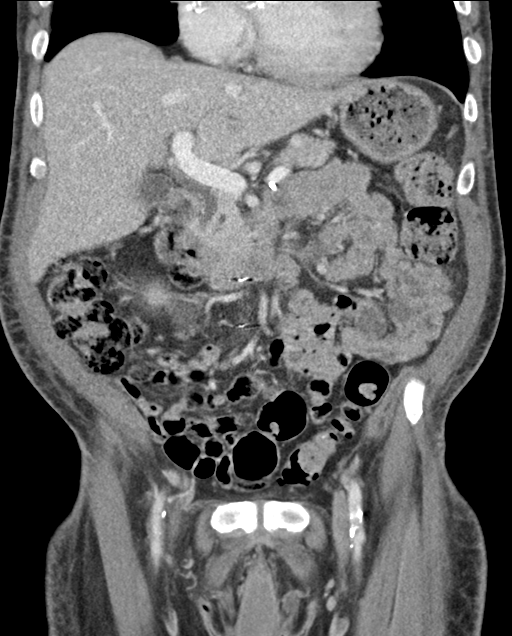
[im 33/75  soft-tissue]
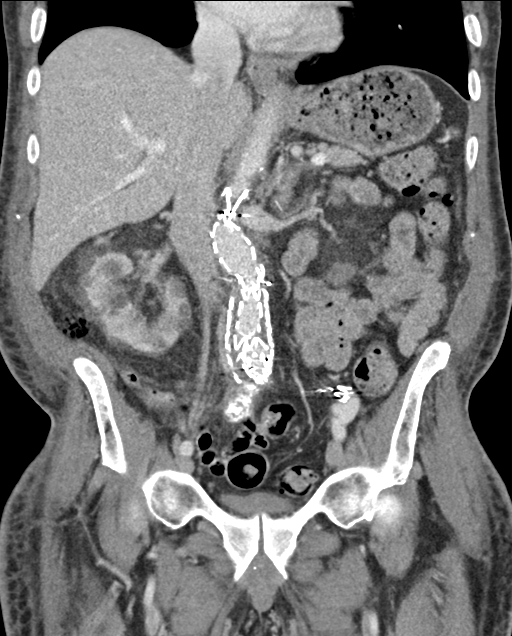
[im 42/75  soft-tissue]
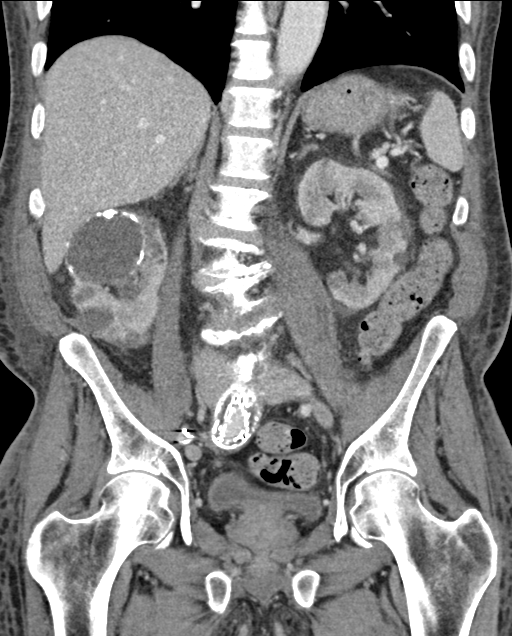

[15 of 46 positions shown; findings below may reference images not displayed]

FINDINGS: Lower chest: The visualized lung bases are grossly clear. Scattered
coronary artery calcifications are seen.

Hepatobiliary: A stone is seen within the gallbladder. The
gallbladder is otherwise unremarkable. The liver is unremarkable in
appearance. The common bile duct remains normal in caliber.

Pancreas: The pancreas is within normal limits.

Spleen: The spleen is unremarkable in appearance.

Adrenals/Urinary Tract: The adrenal glands are grossly unremarkable
in appearance.

Scattered bilateral renal cysts are again noted, with peripheral
calcification and mild underlying renal atrophy. There is a 3.6 x
2.7 cm heterogeneous lesion at the interpole region of the left
kidney, with peripheral fat and mild inflammation. This has
increased only slightly in size from 6340, and given the patient's
history of renal cell carcinoma dating back before 6340, this most
likely reflects an ablation site.

Diffuse perinephric stranding is noted about both kidneys. Would
correlate clinically for any evidence of pyelonephritis. There is no
evidence of hydronephrosis. No renal or ureteral stones are
identified.

Stomach/Bowel: The stomach is unremarkable in appearance. The small
bowel is within normal limits. The appendix is normal in caliber,
without evidence of appendicitis.

Mild diverticulosis is noted along the proximal sigmoid colon,
without evidence of diverticulitis.

Vascular/Lymphatic: The patient is status post aortoiliac stent
graft repair, with thrombosis of the underlying aneurysm sac. The
stent graft appears grossly patent. Underlying calcification is seen
along the abdominal aorta. No retroperitoneal or pelvic sidewall
lymphadenopathy is seen.

Reproductive: The bladder is decompressed and not well assessed. The
prostate remains normal in size.

Other: No additional soft tissue abnormalities are seen.

Musculoskeletal: No acute osseous abnormalities are identified.
Vacuum phenomenon is noted at L2-L3. Mild degenerative change is
noted at the lower lumbar spine. The visualized musculature is
unremarkable in appearance.
IMPRESSION: 1. 3.6 x 2.7 cm heterogeneous lesion at the interpole region of the
left kidney, with peripheral fat and mild inflammation. This has
increased only slightly in size from 6340. Given the patient's
history of renal cell carcinoma dating back before 6340, this most
likely reflects an ablation site.
2. Diffuse perinephric stranding again noted about both kidneys.
Would correlate clinically for any evidence of pyelonephritis.
3. Scattered bilateral renal cysts again noted, with peripheral
calcification and mild underlying renal atrophy.
4. Cholelithiasis.  Gallbladder otherwise unremarkable.
5. Scattered coronary artery calcifications seen.
6. Aortoiliac stent graft appears grossly patent, with thrombosis of
the underlying aneurysm sac. Diffuse aortic atherosclerosis.
7. Mild diverticulosis along the proximal sigmoid colon, without
evidence of diverticulitis.

## 2018-06-23 ENCOUNTER — Other Ambulatory Visit: Payer: Self-pay
